# Patient Record
Sex: Male | Born: 1960 | Race: Black or African American | Hispanic: No | Marital: Single | Smoking: Current every day smoker
Health system: Southern US, Community
[De-identification: ages and names within clinical notes are randomized; demographics above are authoritative.]

## PROBLEM LIST (undated history)

## (undated) DIAGNOSIS — K5090 Crohn's disease, unspecified, without complications: Secondary | ICD-10-CM

## (undated) DIAGNOSIS — J45909 Unspecified asthma, uncomplicated: Secondary | ICD-10-CM

## (undated) DIAGNOSIS — E785 Hyperlipidemia, unspecified: Secondary | ICD-10-CM

## (undated) DIAGNOSIS — F209 Schizophrenia, unspecified: Secondary | ICD-10-CM

## (undated) HISTORY — PX: NO PAST SURGERIES: SHX2092

---

## 2003-10-23 ENCOUNTER — Inpatient Hospital Stay (HOSPITAL_COMMUNITY): Admission: EM | Admit: 2003-10-23 | Discharge: 2003-10-26 | Payer: Self-pay | Admitting: Emergency Medicine

## 2003-10-23 ENCOUNTER — Encounter (INDEPENDENT_AMBULATORY_CARE_PROVIDER_SITE_OTHER): Payer: Self-pay | Admitting: *Deleted

## 2003-12-02 ENCOUNTER — Emergency Department (HOSPITAL_COMMUNITY): Admission: EM | Admit: 2003-12-02 | Discharge: 2003-12-02 | Payer: Self-pay | Admitting: Family Medicine

## 2004-01-17 ENCOUNTER — Ambulatory Visit: Payer: Self-pay | Admitting: Family Medicine

## 2004-09-24 ENCOUNTER — Inpatient Hospital Stay (HOSPITAL_COMMUNITY): Admission: EM | Admit: 2004-09-24 | Discharge: 2004-09-27 | Payer: Self-pay | Admitting: Emergency Medicine

## 2005-05-24 ENCOUNTER — Emergency Department (HOSPITAL_COMMUNITY): Admission: EM | Admit: 2005-05-24 | Discharge: 2005-05-24 | Payer: Self-pay | Admitting: Emergency Medicine

## 2005-06-06 ENCOUNTER — Emergency Department (HOSPITAL_COMMUNITY): Admission: EM | Admit: 2005-06-06 | Discharge: 2005-06-06 | Payer: Self-pay | Admitting: Emergency Medicine

## 2005-11-08 ENCOUNTER — Emergency Department (HOSPITAL_COMMUNITY): Admission: EM | Admit: 2005-11-08 | Discharge: 2005-11-08 | Payer: Self-pay | Admitting: Emergency Medicine

## 2006-07-23 ENCOUNTER — Emergency Department (HOSPITAL_COMMUNITY): Admission: EM | Admit: 2006-07-23 | Discharge: 2006-07-23 | Payer: Self-pay | Admitting: Emergency Medicine

## 2006-08-24 ENCOUNTER — Emergency Department (HOSPITAL_COMMUNITY): Admission: EM | Admit: 2006-08-24 | Discharge: 2006-08-24 | Payer: Self-pay | Admitting: Emergency Medicine

## 2006-09-16 ENCOUNTER — Emergency Department (HOSPITAL_COMMUNITY): Admission: EM | Admit: 2006-09-16 | Discharge: 2006-09-16 | Payer: Self-pay | Admitting: Emergency Medicine

## 2007-04-09 ENCOUNTER — Emergency Department (HOSPITAL_COMMUNITY): Admission: EM | Admit: 2007-04-09 | Discharge: 2007-04-09 | Payer: Self-pay | Admitting: Emergency Medicine

## 2009-01-17 ENCOUNTER — Inpatient Hospital Stay (HOSPITAL_COMMUNITY): Admission: AD | Admit: 2009-01-17 | Discharge: 2009-01-23 | Payer: Self-pay | Admitting: Psychiatry

## 2009-01-17 ENCOUNTER — Other Ambulatory Visit: Payer: Self-pay | Admitting: Emergency Medicine

## 2009-01-17 ENCOUNTER — Ambulatory Visit: Payer: Self-pay | Admitting: Psychiatry

## 2010-03-20 ENCOUNTER — Emergency Department (HOSPITAL_COMMUNITY)
Admission: EM | Admit: 2010-03-20 | Discharge: 2010-03-21 | Disposition: A | Discharge status: Other Acute Inpatient Hospital | Payer: Self-pay | Source: Home / Self Care | Admitting: Emergency Medicine

## 2010-03-21 ENCOUNTER — Inpatient Hospital Stay (HOSPITAL_COMMUNITY)
Admission: AD | Admit: 2010-03-21 | Discharge: 2010-03-28 | Payer: Self-pay | Source: Home / Self Care | Attending: Psychiatry | Admitting: Psychiatry

## 2010-03-21 DIAGNOSIS — F259 Schizoaffective disorder, unspecified: Secondary | ICD-10-CM

## 2010-03-21 LAB — ETHANOL: Alcohol, Ethyl (B): <5 mg/dL (ref 0–10)

## 2010-03-21 LAB — DIFFERENTIAL
Basophils Absolute: 0 10*3/uL (ref 0.0–0.1)
Basophils Relative: 0 % (ref 0–1)
Eosinophils Absolute: 0.1 10*3/uL (ref 0.0–0.7)
Eosinophils Relative: 2 % (ref 0–5)
Lymphocytes Relative: 29 % (ref 12–46)
Lymphs Abs: 1.9 10*3/uL (ref 0.7–4.0)
Monocytes Absolute: 0.9 10*3/uL (ref 0.1–1.0)
Monocytes Relative: 14 % — ABNORMAL HIGH (ref 3–12)
Neutro Abs: 3.5 10*3/uL (ref 1.7–7.7)
Neutrophils Relative %: 55 % (ref 43–77)

## 2010-03-21 LAB — COMPREHENSIVE METABOLIC PANEL
ALT: 19 U/L (ref 0–53)
AST: 23 U/L (ref 0–37)
Albumin: 3.3 g/dL — ABNORMAL LOW (ref 3.5–5.2)
Alkaline Phosphatase: 104 U/L (ref 39–117)
BUN: 1 mg/dL — ABNORMAL LOW (ref 6–23)
CO2: 23 mEq/L (ref 19–32)
Calcium: 10.7 mg/dL — ABNORMAL HIGH (ref 8.4–10.5)
Chloride: 102 mEq/L (ref 96–112)
Creatinine, Ser: 0.89 mg/dL (ref 0.4–1.5)
GFR calc Af Amer: >60 mL/min (ref >60)
GFR calc non Af Amer: >60 mL/min (ref >60)
Glucose, Bld: 105 mg/dL — ABNORMAL HIGH (ref 70–99)
Potassium: 3.8 mEq/L (ref 3.5–5.1)
Sodium: 132 mEq/L — ABNORMAL LOW (ref 135–145)
Total Bilirubin: 0.4 mg/dL (ref 0.3–1.2)
Total Protein: 7.8 g/dL (ref 6.0–8.3)

## 2010-03-21 LAB — CBC
HCT: 35.5 % — ABNORMAL LOW (ref 39.0–52.0)
Hemoglobin: 12.1 g/dL — ABNORMAL LOW (ref 13.0–17.0)
MCH: 32.4 pg (ref 26.0–34.0)
MCHC: 34.1 g/dL (ref 30.0–36.0)
MCV: 94.9 fL (ref 78.0–100.0)
Platelets: 350 10*3/uL (ref 150–400)
RBC: 3.74 MIL/uL — ABNORMAL LOW (ref 4.22–5.81)
RDW: 12.1 % (ref 11.5–15.5)
WBC: 6.4 10*3/uL (ref 4.0–10.5)

## 2010-03-21 LAB — VALPROIC ACID LEVEL: Valproic Acid Lvl: 69.2 ug/mL (ref 50.0–100.0)

## 2010-04-02 LAB — COMPREHENSIVE METABOLIC PANEL
ALT: 16 U/L (ref 0–53)
AST: 17 U/L (ref 0–37)
Albumin: 3.1 g/dL — ABNORMAL LOW (ref 3.5–5.2)
Alkaline Phosphatase: 109 U/L (ref 39–117)
BUN: 7 mg/dL (ref 6–23)
CO2: 27 mEq/L (ref 19–32)
Calcium: 10.5 mg/dL (ref 8.4–10.5)
Chloride: 101 mEq/L (ref 96–112)
Creatinine, Ser: 1.01 mg/dL (ref 0.4–1.5)
GFR calc Af Amer: >60 mL/min (ref >60)
GFR calc non Af Amer: >60 mL/min (ref >60)
Glucose, Bld: 86 mg/dL (ref 70–99)
Potassium: 4.4 mEq/L (ref 3.5–5.1)
Sodium: 133 mEq/L — ABNORMAL LOW (ref 135–145)
Total Bilirubin: 0.7 mg/dL (ref 0.3–1.2)
Total Protein: 7.4 g/dL (ref 6.0–8.3)

## 2010-04-02 LAB — DIFFERENTIAL
Basophils Absolute: 0.1 10*3/uL (ref 0.0–0.1)
Basophils Relative: 1 % (ref 0–1)
Eosinophils Absolute: 0.1 10*3/uL (ref 0.0–0.7)
Eosinophils Relative: 3 % (ref 0–5)
Lymphocytes Relative: 32 % (ref 12–46)
Lymphs Abs: 1.7 10*3/uL (ref 0.7–4.0)
Monocytes Absolute: 0.6 10*3/uL (ref 0.1–1.0)
Monocytes Relative: 12 % (ref 3–12)
Neutro Abs: 2.8 10*3/uL (ref 1.7–7.7)
Neutrophils Relative %: 53 % (ref 43–77)

## 2010-04-02 LAB — CBC
HCT: 35.7 % — ABNORMAL LOW (ref 39.0–52.0)
Hemoglobin: 12 g/dL — ABNORMAL LOW (ref 13.0–17.0)
MCH: 32.6 pg (ref 26.0–34.0)
MCHC: 33.6 g/dL (ref 30.0–36.0)
MCV: 97 fL (ref 78.0–100.0)
Platelets: 367 10*3/uL (ref 150–400)
RBC: 3.68 MIL/uL — ABNORMAL LOW (ref 4.22–5.81)
RDW: 11.9 % (ref 11.5–15.5)
WBC: 5.4 10*3/uL (ref 4.0–10.5)

## 2010-05-28 LAB — RAPID URINE DRUG SCREEN, HOSP PERFORMED
Amphetamines: NOT DETECTED
Barbiturates: NOT DETECTED
Benzodiazepines: NOT DETECTED
Cocaine: NOT DETECTED
Opiates: NOT DETECTED
Tetrahydrocannabinol: NOT DETECTED

## 2010-06-20 LAB — DIFFERENTIAL
Basophils Absolute: 0.1 10*3/uL (ref 0.0–0.1)
Basophils Relative: 2 % — ABNORMAL HIGH (ref 0–1)
Eosinophils Absolute: 0.1 10*3/uL (ref 0.0–0.7)
Eosinophils Relative: 2 % (ref 0–5)
Lymphocytes Relative: 34 % (ref 12–46)
Lymphs Abs: 1.8 10*3/uL (ref 0.7–4.0)
Monocytes Absolute: 0.6 10*3/uL (ref 0.1–1.0)
Monocytes Relative: 12 % (ref 3–12)
Neutro Abs: 2.5 10*3/uL (ref 1.7–7.7)
Neutrophils Relative %: 50 % (ref 43–77)

## 2010-06-20 LAB — URINALYSIS, ROUTINE W REFLEX MICROSCOPIC
Bilirubin Urine: NEGATIVE
Glucose, UA: NEGATIVE mg/dL
Hgb urine dipstick: NEGATIVE
Ketones, ur: 40 mg/dL — AB
Nitrite: NEGATIVE
Protein, ur: NEGATIVE mg/dL
Specific Gravity, Urine: 1.019 (ref 1.005–1.030)
Urobilinogen, UA: 1 mg/dL (ref 0.0–1.0)
pH: 6 (ref 5.0–8.0)

## 2010-06-20 LAB — POCT TOXICOLOGY PANEL: Benzodiazepines: POSITIVE

## 2010-06-20 LAB — CBC
HCT: 43.7 % (ref 39.0–52.0)
Hemoglobin: 14.7 g/dL (ref 13.0–17.0)
MCHC: 33.7 g/dL (ref 30.0–36.0)
MCV: 98.5 fL (ref 78.0–100.0)
Platelets: 393 10*3/uL (ref 150–400)
RBC: 4.43 MIL/uL (ref 4.22–5.81)
RDW: 12.6 % (ref 11.5–15.5)
WBC: 5.1 10*3/uL (ref 4.0–10.5)

## 2010-06-20 LAB — ETHANOL: Alcohol, Ethyl (B): <5 mg/dL (ref 0–10)

## 2010-06-20 LAB — COMPREHENSIVE METABOLIC PANEL WITH GFR
ALT: 18 U/L (ref 0–53)
AST: 18 U/L (ref 0–37)
Albumin: 5 g/dL (ref 3.5–5.2)
Alkaline Phosphatase: 163 U/L — ABNORMAL HIGH (ref 39–117)
BUN: 9 mg/dL (ref 6–23)
CO2: 23 meq/L (ref 19–32)
Calcium: 11.6 mg/dL — ABNORMAL HIGH (ref 8.4–10.5)
Chloride: 106 meq/L (ref 96–112)
Creatinine, Ser: 0.9 mg/dL (ref 0.4–1.5)
GFR calc non Af Amer: >60 mL/min
Glucose, Bld: 94 mg/dL (ref 70–99)
Potassium: 4.4 meq/L (ref 3.5–5.1)
Sodium: 141 meq/L (ref 135–145)
Total Bilirubin: 0.8 mg/dL (ref 0.3–1.2)
Total Protein: 9.4 g/dL — ABNORMAL HIGH (ref 6.0–8.3)

## 2010-08-03 NOTE — Consult Note (Signed)
Jeff Long, Jeff Long NO.:  [id number]   MEDICAL RECORD NO.:  [id number]                   PATIENT TYPE:  INP   LOCATION:  3705                                 FACILITY:  [facility]   PHYSICIAN:  James L. Malon Kindle., M.D.          DATE OF BIRTH:  [birth year]   DATE OF CONSULTATION:  10/24/2003  DATE OF DISCHARGE:                                   CONSULTATION   REFERRED BY:  Margarito Liner, M.D.   HISTORY:  A 50 year old African-American male who was admitted with  dizziness.  He has a questionable history of Crohn's disease.  He states  this was diagnosed around 38 in another town.  He is not sure what town or  how it was diagnosed.  He is not clear if he had procedures done or x-rays.  He does not recall ever having a colonoscopy.  He has had orthostatic  changes and dizziness.  His hemoglobin, however, has dropped a bit from 12.7  to 11.  His stools are positive.  He notes he has normal bowel movements,  denies any blood in his stool, and denies diarrhea.  A recent upright  abdominal series was normal.  Looked at this and there may have been a  single air/fluid level.  We are asked to see him regarding possible active  Crohn's or ulcers.  The patient notes that he has had a history of ulcers.  Again, he does not know when, where, or how they were diagnosed.  He denies  any abdominal pain.  His main complaint is nausea, vomiting, and dizziness.   MEDICATIONS ON ADMISSION:  1. Mercaptopurine 50 mg daily.  2. Ditropan XL.  3. Thiothixene 20 mg b.i.d.  4. Benztropine 0.5 mg b.i.d.  5. Klonopin 400 mg q.h.s.  6. Famotidine 20 mg b.i.d.   He denies any drug allergies.   MEDICAL HISTORY:  A history of ulcer disease, history of some sort of  schizophrenia, a history of Crohn's disease.  He denies any abdominal  operations.   SOCIAL HISTORY:  He is single, unemployed.  Has Medicare and Medicaid.  Lives in a group home and does not drink but has smoked  for 30 years.   FAMILY HISTORY:  Mother has no health problems.  He does not know anything  about his siblings.  His father apparently is in his 63s and has no health  problems either.   PHYSICAL EXAMINATION:  VITAL SIGNS:  The patient is afebrile, blood pressure  106/60, pulse 100.  GENERAL:  An pleasant African-American male in no acute distress.  HEENT:  Eyes clear, nonicteric.  NECK:  Supple, no lymphadenopathy.  LUNGS:  Clear.  HEART:  Regular rate and rhythm without murmurs or gallops.  ABDOMEN:  Soft, nondistended and totally nontender.  RECTAL:  No performed but per the Resident his stools were heme-positive.   ASSESSMENT:  Nausea and vomiting with a  history of ulcer and Crohn's disease  and positive stools--this could be due to a multitude of things but does not  appear that he has active Crohn's disease.  I wonder if this could be an  ulcer or could be medication related.   PLAN:  I have discussed options with the patient.  I would recommend an  upper GI and small bowel series which would evaluate for possible ulcer,  outlet obstruction, as well as for active Crohn's.  The patient said that he  has decided that he does not wish to have this done.  He signs for himself  and will not consent to any procedures or x-rays at this time.  This would  be my recommendation if he does consent to further workup.                                               James L. Malon Kindle., M.D.    Waldron Session  D:  10/24/2003  T:  10/25/2003  Job:  [id number]   cc:   Ileana Roup, M.D.  [address] [place] [address]  Fax: [phone]

## 2010-08-03 NOTE — Discharge Summary (Signed)
Jeff Long, Jeff Long NO.:  [id number]   MEDICAL RECORD NO.:  [id number]          PATIENT TYPE:  INP   LOCATION:  1512                         FACILITY:  [facility]   PHYSICIAN:  Fleet Contras, M.D.    DATE OF BIRTH:  1961-03-05   DATE OF ADMISSION:  09/24/2004  DATE OF DISCHARGE:                                 DISCHARGE SUMMARY   error       EA/MEDQ  D:  09/27/2004  T:  09/27/2004  Job:  [id number]

## 2010-08-03 NOTE — Discharge Summary (Signed)
NAMEFARRIS, GEIMAN NO.:  [id number]   MEDICAL RECORD NO.:  [id number]                   PATIENT TYPE:  INP   LOCATION:  3705                                 FACILITY:  [facility]   PHYSICIAN:  Chapman Fitch, MD                    DATE OF BIRTH:  [birth year]   DATE OF ADMISSION:  10/23/2003  DATE OF DISCHARGE:  10/26/2003                                 DISCHARGE SUMMARY   PRIMARY CARE PHYSICIAN:  1. Dr. Hortencia Pilar for psychiatry  2. Dr. Jeri Cos   CHIEF COMPLAINT:  Dizziness.   DISCHARGE DIAGNOSES:  1. Dehydration secondary to gastroenteritis.  2. Acute renal failure, now resolved.  3. Schizophrenia controlled on Clozaril and Navane.  4. History of Crohn's disease in remission since 2002, controlled on 6-MP     followed by [facility].  5. Anemia.  6. Tobacco abuse.   MEDICATIONS:  1. Clozapine 400 mg p.o. q.h.s.  2. Mercaptopurine 50 mg daily p.o.  3. Thiothixene 20 mg p.o. b.i.d.  4. Benztropine 0.5 mg p.o. b.i.d.  5. Ditropan XL 10 mg b.i.d.  6. Famotidine 20 mg b.i.d.   CONDITION ON DISCHARGE:  The patient is stable.   FOLLOWUP:  The patient is to return to Dr. Jeri Cos at 3:45 on August  18 for follow-up of his hospital admission.  He is to return to his  [facility] as well.  He is to return to Dr. Hortencia Pilar at [facility] as directed for his psychiatric needs.   PROCEDURE:  1. On admission on August 7 he had an EKG which showed a normal sinus rhythm     with questionable early repolarization.  Of note, the QTC interval was     increased at 495 msec __________ 144 msec.  An EKG on August 8 showed a     QTC interval at that point had dropped to 444 msec.  2. Plain film x-ray of the abdomen showed distention of the stomach and     proximal duodenum with prominent air fluid levels.  This was on August 8.  3. Also on August 8, a renal ultrasound demonstrated no abnormality noted of     the kidneys which appeared normal in size without hydronephrosis or     stone.   ADMISSION LABORATORIES:  On admission the patient had sodium 124, potassium  3.5, chloride 80, CO2 37 glucose 130, BUN 66, creatinine 5.8, albumin 3.5,  total protein 8.5, calcium 10.3.  AST 14, ALT 9, alkaline phosphatase 88,  total bilirubin 1.3, magnesium 2.2, amylase 64, lipase 50, venous lactic  acid 0.9, serum osmolality 293, urine osmolality 431.  Cardiac enzymes were  negative x3.  Salicylate level was negative.  Urine drug screen was negative  for all detectable drugs.  Urine sodium was 29 mEq/L.  Urine creatinine was  202 mg/dl.  The patient's urinalysis showed small amount of bilirubin,  otherwise no abnormalities.  CBC showed a white count of 10.7, a hemoglobin  of 12.7, a hematocrit of 37.9, MCV of 95.8, platelet count of 518.  ABG  showed a pH of 7.49, pCO2 of 59.8, bicarbonate of 46.5, a total CO2 of 48,  O2 saturation of 93.5.  On admission the patient was afebrile at 37.0.   CONSULTS:  1. Dr. Jeanie Sewer of psychiatry  2. Dr. Carman Ching, GI consult   BRIEF ADMISSION HISTORY AND PHYSICAL:  This is a 50 year old African-  American male with past medical history of schizophrenia, Crohn's disease,  and anemia who was admitted to the hospital on October 23, 2003 with a three  to four day history of intractable nausea and vomiting and dizziness with a  possible syncopal episode.  Denies any fever, chills, shortness of breath,  chest pain, palpitations.  The nausea and vomiting was described as several  times a day with no hematemesis.  The patient had not had any changes in  diet recently.  Without blood in the stool, dysuria, or hematuria.  He  endorses adequate p.o. intake even with his nausea and vomiting.  Denies any  palpitations.  Of note, the patient was discharged from [facility] approximately one month ago and has since been living at the  [facility] in  [place].   HOSPITAL COURSE:  #1 - DIZZINESS:  The patient was resuscitated in the ER  using a bolus of normal saline.  He was also noted at time to be  orthostatic.  After resuscitation he was considered stable and was  transferred to the floor.  His electrolytes suggested a contraction  alkalosis and patient was resuscitated with normal saline.  He was then  admitted to the floor where he was continued to be resuscitated with normal  saline and he improved by the morning where he underwent a renal ultrasound  to rule out obstruction.  This was negative.  A plain film of the abdomen to  rule out small bowel obstruction was also negative for any type of specific  obstructive pattern.  At this time focused on resuscitating his volume  status.  He tolerated p.o. well and moved from clears to solid fluids by the  9th.  Upon discharge he was no longer orthostatic and his laboratory values  had improved.  His sodium was 139, potassium 4.3, chloride 107, bicarbonate  28, BUN 17, creatinine 1.4.  After day two of his hospitalization he had no  more further vomiting or nausea.  Due to this fact and the fact that his  symptoms improved on volume resuscitation, it is likely that his dizziness  was due to a hypovolemic state versus a cardiac etiology to his syncope  which could also be possible due to his elevated QTC interval.  #2 - ACUTE RENAL FAILURE:  No baseline creatinine was known for the patient  but on admission his creatinine was 5.8.  He had no documented history of  renal failure in the chart.  On volume resuscitation his creatinine fell  from 5.8 to 1.4.  It is likely that this is due to a prerenal etiology due  to his low volume state as opposed to chronic renal failure or obstructive  renal failure since no urine eosinophils were seen and no obstruction was  found on renal ultrasound.  #3 - SCHIZOPHRENIA:  The patient was discontinued on his Clozaril on admission due to  the  possibility of increasing his QTC interval and causing  his possible syncopal episode.  The patient was continued on his Navane as  well as his benztropine.  During his admission the patient initially was  cooperative which is his baseline.  During his admission he became slowly  more agitated and began refusing medications as well as his laboratory draws  as well as food.  Dr. Jeanie Sewer from psychiatry was consulted.  Initially,  he was interested in looking over the past records on Mr. Hammonds to make  sure that everything had previously been tried.  Records from [facility] were obtained which showed that the patient had been tried on  multiple different regimens during his stay there.  He initially was tried  on olanzapine, Moban, Navane, and Luvox; however, he was unable to be  controlled on this regimen.  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  He  was discontinued on Clozaril at that time.  This was back in 2000.  After  failing other regimens he was then restarted on his Clozaril in 2003 along  with Navane.  It was felt, according to the records at [facility], that he was well controlled on his Clozaril in terms of stopping  his aggression and the Navane was helping with his hallucinations.  He  remained stable on Clozaril and Navane without any more episodes and was  discharged from [facility] in 2005 about one month before admission.  When  those records were shown to Dr. Jeanie Sewer he suggested that the patient  remain on his Navane and Clozaril and his Clozaril was readministered at 400  mg p.o. daily.  The patient then became more cooperative.  On the date of  discharge he took his medication and his laboratories.  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  He will follow up with his normal  primary care providers for follow-up on his white blood cell count.  He will  also need a follow-up EKG to determine if his QTC interval has increased.  Per Dr. Jeanie Sewer, it is likely that since he has been refractory to so many  different schizophrenic medications that the QTC interval may be something  he has to live with in order to control his schizophrenia.  #4 CROHN'S DISEASE:  The patient has been asymptomatic from Crohn's disease  since 2002 where he underwent a colonoscopy which showed some friable mucosa  in the colonic area as well as some evidence of gastritis.  The patient has  been followed up by [facility].  Last appointment he had was with  Dr. Anne Shutter in January of 2004.  Due to this Crohn's history and his  presentation with possible obstruction as well as hematocrit that was  suggestive of anemia, Dr. Randa Evens suggested the patient undergo an upper GI with a small bowel follow through to try to discover the source of a  possible bleed.  The patient refused this intervention stating that he  wanted to go home and would not tolerate it.  We feel that since his volume  state was resuscitated and he was doing better that this is something that  is not needed emergently  as an inpatient.  However, we feel that he may need  some follow-up for that in the future as an outpatient.  #5 - ANEMIA:  The patient has hematocrit went from 12.7 to 11.8 on  discharge.  The patient was asymptomatic on discharge and we did not feel  like we needed to check his hemoglobin at this time.  However, it should be  at his follow-up appointment that he has his hemoglobin checked to check for  any interval change of anemia.  #6 - TOBACCO ABUSE:  The patient was counseled to stop smoking.  He denied a  smoking cessation consult, stating that he does not feel like he wants to  quit at this  juncture.      Dub Amis, MD    PA/MEDQ  D:  10/27/2003  T:  10/28/2003  Job:  [id number]   cc:   Jarome Matin, M.D.  [address] [place]  [place] [address]  Fax: [phone]   Antonietta Breach, M.D.

## 2010-08-03 NOTE — Discharge Summary (Signed)
NAMEAMIAS, Jeff NO.:  [id number]   MEDICAL RECORD NO.:  [id number]          PATIENT TYPE:  INP   LOCATION:  1512                         [facility]ACILITY[facility]   PHYSICIAN:  Fleet Contras, M.D.    DATE OF BIRTH:  [birth year]   DATE OF ADMISSION:  09/24/2004  DATE OF DISCHARGE:  09/27/2004                                 DISCHARGE SUMMARY   HISTORY OF PRESENT ILLNESS:  Mr. Eads is a 50 year old, African-American  gentleman with past medical history significant for schizophrenia.  He is a  resident at a family care group home.  He presented to the emergency room  with complaints of 1 week history of abdominal pain associated with nausea  and vomiting.  This has progressively worsened and on the day of  presentation he was unable to keep his food and drink down.  In the  emergency room, his vital signs were stable, but he was dehydrated.  The  initial laboratory data showed sodium of 131.  Abdominal x-ray shows  evidence of partial small bowel obstruction likely due to ileus.  He was  therefore admitted to the hospital for monitoring and IV therapy.   HOSPITAL COURSE:  On admission, the patient was placed on a clear liquid  diet, IV fluids with D-5 normal saline, potassium as well as IV Protonix 40  mg once a day.  His symptoms improved significantly and was able to tolerate  advance in his diet.  CT scan of the abdomen was performed and did show  mucosal thickening of the terminal ileum, cecum, transverse and descending  colon consistent with inflammatory bowel disease.  He has a history of  Crohn's disease.  Due to his improvement with conservative management, no  further intervention was performed.  Today, he is completely pain free.  He  is tolerating full diet.  His vital signs are stable.  His abdomen is soft,  nontender with bowel sounds present.  He is therefore considered stable for  discharge home.   DISCHARGE MEDICATIONS:  1.  Ditropan XL 5 mg once a  day.  2.  Clozaril 400 mg q.h.s.  3.  Cogentin 0.5 mg b.i.d.  4.  Protonix 40 mg once a day.   FOLLOW UP:  He will follow up with Dr. Fleet Contras in 2 weeks.   DISPOSITION:  To home.   CONDITION ON DISCHARGE:  Stable.       EA/MEDQ  D:  09/27/2004  T:  09/27/2004  Job:  [id number]

## 2010-08-29 ENCOUNTER — Inpatient Hospital Stay (HOSPITAL_COMMUNITY)
Admission: EM | Admit: 2010-08-29 | Discharge: 2010-09-03 | DRG: 386 | Disposition: A | Discharge status: Home / Self Care | Payer: Medicaid Other | Source: Ambulatory Visit | Attending: Internal Medicine | Admitting: Internal Medicine

## 2010-08-29 ENCOUNTER — Emergency Department (HOSPITAL_COMMUNITY): Payer: Medicaid Other

## 2010-08-29 DIAGNOSIS — I319 Disease of pericardium, unspecified: Secondary | ICD-10-CM | POA: Diagnosis present

## 2010-08-29 DIAGNOSIS — R0789 Other chest pain: Secondary | ICD-10-CM | POA: Diagnosis present

## 2010-08-29 DIAGNOSIS — F17200 Nicotine dependence, unspecified, uncomplicated: Secondary | ICD-10-CM | POA: Diagnosis present

## 2010-08-29 DIAGNOSIS — F319 Bipolar disorder, unspecified: Secondary | ICD-10-CM | POA: Diagnosis present

## 2010-08-29 DIAGNOSIS — R627 Adult failure to thrive: Secondary | ICD-10-CM | POA: Diagnosis present

## 2010-08-29 DIAGNOSIS — K5010 Crohn's disease of large intestine without complications: Principal | ICD-10-CM | POA: Diagnosis present

## 2010-08-29 DIAGNOSIS — F209 Schizophrenia, unspecified: Secondary | ICD-10-CM | POA: Diagnosis present

## 2010-08-29 DIAGNOSIS — K56609 Unspecified intestinal obstruction, unspecified as to partial versus complete obstruction: Secondary | ICD-10-CM | POA: Diagnosis present

## 2010-08-29 DIAGNOSIS — D509 Iron deficiency anemia, unspecified: Secondary | ICD-10-CM | POA: Diagnosis present

## 2010-08-29 DIAGNOSIS — IMO0002 Reserved for concepts with insufficient information to code with codable children: Secondary | ICD-10-CM

## 2010-08-29 DIAGNOSIS — R634 Abnormal weight loss: Secondary | ICD-10-CM | POA: Diagnosis present

## 2010-08-29 LAB — DIFFERENTIAL
Basophils Absolute: 0.1 10*3/uL (ref 0.0–0.1)
Basophils Relative: 1 % (ref 0–1)
Eosinophils Absolute: 0.2 10*3/uL (ref 0.0–0.7)
Eosinophils Relative: 2 % (ref 0–5)
Lymphocytes Relative: 31 % (ref 12–46)
Lymphs Abs: 2.5 10*3/uL (ref 0.7–4.0)
Monocytes Absolute: 0.8 10*3/uL (ref 0.1–1.0)
Monocytes Relative: 11 % (ref 3–12)
Neutro Abs: 4.4 10*3/uL (ref 1.7–7.7)
Neutrophils Relative %: 56 % (ref 43–77)

## 2010-08-29 LAB — COMPREHENSIVE METABOLIC PANEL
AST: 8 U/L (ref 0–37)
Albumin: 2.2 g/dL — ABNORMAL LOW (ref 3.5–5.2)
Calcium: 10.3 mg/dL (ref 8.4–10.5)
Creatinine, Ser: 0.71 mg/dL (ref 0.4–1.5)
Total Protein: 6.9 g/dL (ref 6.0–8.3)

## 2010-08-29 LAB — CBC
HCT: 25.8 % — ABNORMAL LOW (ref 39.0–52.0)
Hemoglobin: 8.5 g/dL — ABNORMAL LOW (ref 13.0–17.0)
MCH: 32.2 pg (ref 26.0–34.0)
MCHC: 32.9 g/dL (ref 30.0–36.0)
MCV: 97.7 fL (ref 78.0–100.0)
Platelets: 414 10*3/uL — ABNORMAL HIGH (ref 150–400)
RBC: 2.64 MIL/uL — ABNORMAL LOW (ref 4.22–5.81)
RDW: 15.6 % — ABNORMAL HIGH (ref 11.5–15.5)
WBC: 7.9 10*3/uL (ref 4.0–10.5)

## 2010-08-29 LAB — CARDIAC PANEL(CRET KIN+CKTOT+MB+TROPI)
CK, MB: 0.8 ng/mL (ref 0.3–4.0)
Relative Index: INVALID (ref 0.0–2.5)
Total CK: 14 U/L (ref 7–232)
Troponin I: <0.3 ng/mL (ref <0.30)

## 2010-08-29 LAB — OCCULT BLOOD, POC DEVICE: Fecal Occult Bld: POSITIVE

## 2010-08-30 ENCOUNTER — Observation Stay (HOSPITAL_COMMUNITY): Payer: Medicaid Other

## 2010-08-30 LAB — LIPID PANEL
Cholesterol: 148 mg/dL (ref 0–200)
Triglycerides: 96 mg/dL (ref <150)
VLDL: 19 mg/dL (ref 0–40)

## 2010-08-30 LAB — IRON AND TIBC
Iron: 30 ug/dL — ABNORMAL LOW (ref 42–135)
Saturation Ratios: 17 % — ABNORMAL LOW (ref 20–55)
TIBC: 178 ug/dL — ABNORMAL LOW (ref 215–435)
UIBC: 148 ug/dL

## 2010-08-30 LAB — TSH: TSH: 1.047 u[IU]/mL (ref 0.350–4.500)

## 2010-08-30 LAB — LITHIUM LEVEL: Lithium Lvl: <0.25 mEq/L — ABNORMAL LOW (ref 0.80–1.40)

## 2010-08-30 LAB — CARDIAC PANEL(CRET KIN+CKTOT+MB+TROPI)
CK, MB: 1 ng/mL (ref 0.3–4.0)
Total CK: 9 U/L (ref 7–232)

## 2010-08-30 LAB — VITAMIN B12
Vitamin B-12: 675 pg/mL (ref 211–911)
Vitamin B-12: 752 pg/mL (ref 211–911)

## 2010-08-30 LAB — CBC
HCT: 23.3 % — ABNORMAL LOW (ref 39.0–52.0)
HCT: 28.5 % — ABNORMAL LOW (ref 39.0–52.0)
Hemoglobin: 9.2 g/dL — ABNORMAL LOW (ref 13.0–17.0)
MCHC: 33.2 g/dL (ref 30.0–36.0)
MCV: 97.1 fL (ref 78.0–100.0)
Platelets: 374 10*3/uL (ref 150–400)
RBC: 2.4 MIL/uL — ABNORMAL LOW (ref 4.22–5.81)
RBC: 2.96 MIL/uL — ABNORMAL LOW (ref 4.22–5.81)
RDW: 15.5 % (ref 11.5–15.5)
WBC: 5.8 10*3/uL (ref 4.0–10.5)
WBC: 5.1 10*3/uL (ref 4.0–10.5)

## 2010-08-30 LAB — COMPREHENSIVE METABOLIC PANEL
ALT: <5 U/L (ref 0–53)
Calcium: 10 mg/dL (ref 8.4–10.5)
Creatinine, Ser: 0.76 mg/dL (ref 0.4–1.5)
GFR calc Af Amer: >60 mL/min (ref >60)
Glucose, Bld: 85 mg/dL (ref 70–99)
Sodium: 138 mEq/L (ref 135–145)
Total Protein: 5.9 g/dL — ABNORMAL LOW (ref 6.0–8.3)

## 2010-08-30 LAB — LACTIC ACID, PLASMA: Lactic Acid, Venous: 0.5 mmol/L (ref 0.5–2.2)

## 2010-08-30 LAB — VALPROIC ACID LEVEL: Valproic Acid Lvl: 29.4 ug/mL — ABNORMAL LOW (ref 50.0–100.0)

## 2010-08-30 LAB — PROTIME-INR: INR: 1.14 (ref 0.00–1.49)

## 2010-08-30 LAB — MAGNESIUM: Magnesium: 1.7 mg/dL (ref 1.5–2.5)

## 2010-08-30 MED ORDER — IOHEXOL 300 MG/ML  SOLN
100.0000 mL | Freq: Once | INTRAMUSCULAR | Status: AC | PRN
  Administered 2010-08-30: 100 mL via INTRAVENOUS

## 2010-08-31 ENCOUNTER — Other Ambulatory Visit: Payer: Self-pay | Admitting: Gastroenterology

## 2010-08-31 DIAGNOSIS — R072 Precordial pain: Secondary | ICD-10-CM

## 2010-08-31 LAB — TYPE AND SCREEN: ABO/RH(D): AB POS

## 2010-08-31 LAB — BASIC METABOLIC PANEL
Chloride: 110 mEq/L (ref 96–112)
GFR calc Af Amer: >60 mL/min (ref >60)
Potassium: 4.2 mEq/L (ref 3.5–5.1)

## 2010-08-31 LAB — ANA: Anti Nuclear Antibody(ANA): NEGATIVE

## 2010-08-31 LAB — CBC
Platelets: 339 10*3/uL (ref 150–400)
RDW: 15.5 % (ref 11.5–15.5)
WBC: 4.4 10*3/uL (ref 4.0–10.5)

## 2010-09-01 LAB — CBC
Hemoglobin: 9.7 g/dL — ABNORMAL LOW (ref 13.0–17.0)
MCHC: 32.6 g/dL (ref 30.0–36.0)
RBC: 3.08 MIL/uL — ABNORMAL LOW (ref 4.22–5.81)

## 2010-09-01 LAB — BASIC METABOLIC PANEL
CO2: 25 mEq/L (ref 19–32)
GFR calc non Af Amer: >60 mL/min (ref >60)
Glucose, Bld: 157 mg/dL — ABNORMAL HIGH (ref 70–99)
Potassium: 4.6 mEq/L (ref 3.5–5.1)
Sodium: 137 mEq/L (ref 135–145)

## 2010-09-09 NOTE — Discharge Summary (Signed)
NAMEAVIRAJ, KENTNER NO.:  [id number]  MEDICAL RECORD NO.:  [id number]  LOCATION:  3731                         FACILITY:  [facility]  PHYSICIAN:  Ladell Pier, M.D.   DATE OF BIRTH:  [birth year]  DATE OF ADMISSION:  08/29/2010 DATE OF DISCHARGE:  09/01/2010                              DISCHARGE SUMMARY   DISCHARGE DIAGNOSES: 1. Crohn colitis with abdominal pain. 2. Chest pain that is not resolved but negative echo. 3. Weight loss. 4. Failure to thrive. 5. Schizophrenia. 6. Bipolar disorder. 7. Ongoing tobacco use. 8. Anemia.  DISCHARGE MEDICATIONS: 1. Benztropine 1 mg twice daily. 2. Depakote 500 mg tablet 1000 mg at bedtime. 3. Iron sulfate 325 mg daily. 4. Zyprexa 20 mg at bedtime. 5. Prednisone 40 mg daily for 1 week and then 30 mg daily. 6. Vitamin D3 with 1000 units daily. 7. Haldol 10 mg 1 tablet in the morning and 2 tablets p.m. 8. Haldol injections every 4 weeks 1 mL IM.  FOLLOWUP APPOINTMENTS:  The patient is to follow up with Dr. Evette Cristal on July 2 at 2 p.m.  PROCEDURES:  Colonoscopy done by Dr. Evette Cristal, showed severe Crohn colitis with ulcers, cobblestoning, edema of mucosa, friability of mucosa, and question of a stricture in the proximal descending colon.  CONSULTANTS:  GI, Dr. Evette Cristal.  OTHER PROCEDURES:  CT scan of the chest trace bilateral pleural effusion, no acute pulmonary disease.  CT scan of the abdomen and pelvis showed diffuse colitis with inflammation of the terminal ileum and appendix, findings most compatible with irritable bowel disease infection including C.diff should be considered as well, ischemia unlikely based on the distribution and relative lack of atherosclerotic changes, vague low density lesion in the right hepatic lobe adjacent to a cyst of uncertain clinical significance.  No distortion of vessel that may represent a focal area of fatty infiltration and small amount of reactive ascites.  HISTORY OF PRESENT  ILLNESS:  The patient is a 50 year old African American male with past medical history significant for schizophrenia and bipolar disorder.  The patient with known history of Crohn disease, came in, presented with abdominal pain for the past few months.  The patient also has been having chronic diarrhea at least 4-5 episodes everyday.  He states he did not notice any blood in it.  He also had some nausea and vomiting yesterday, one episode on the way to the ER. He developed some chest pain which lasted for a few minutes in the anterior wall of the chest.  Please see admission note for remainder of history, past medical history, family history, social history, meds, allergies, review of systems per admission H and P.  PHYSICAL EXAMINATION:  VITAL SIGNS:  Temperature 97.5, pulse of 59, respirations 16, blood pressure 148/81, and pulse ox 100% on room air. GENERAL:  The patient is laying in bed, well-nourished African American male. HEENT:  Normocephalic and atraumatic.  Pupils reactive to light without erythema. CARDIOVASCULAR:  Regular rate and rhythm. LUNGS:  Clear bilaterally. ABDOMEN:  Positive bowel sounds.  Soft, nontender. EXTREMITIES:  Without edema.  HOSPITAL COURSE: 1. Crohn colitis:  The patient was admitted to the hospital.  GI was  consulted on the patient.  He had a colonoscopy done that did show     inflammatory bowel disease.  He was placed on prednisone switched     to Solu-Medrol IV and then he will be switched to prednisone 40 mg     daily for 7 days and then 30 mg daily and follow up with Dr. Evette Cristal     in the office, at that time other adjustments can be made to his     medications. 2. Bipolar/schizophrenia, continue on his home medications. 3. Anemia:  This could be secondary to his Crohn colitis and     inflammatory process that goes with that.  DISCHARGE LABS:  Sodium 137, potassium 4.6, chloride 109, CO2 of 25, glucose 157, BUN 4, creatinine 0.69.  WBC 5.8,  hemoglobin 9.7, MCV 96.8, and platelet of 389.  ANA negative.  B12 752. HIV negative.  C. diff negative.  Time spent with the patient and doing this discharge is approximately 40 minutes.     Ladell Pier, M.D.     [place]/MEDQ  D:  09/03/2010  T:  09/03/2010  Job:  [id number]  Electronically Signed by Ladell Pier M.D. on 09/09/2010 07:34:50 PM

## 2010-09-10 NOTE — H&P (Signed)
Jeff Long, Jeff Long NO.:  [id number]  MEDICAL RECORD NO.:  [id number]  LOCATION:  [facility]                         FACILITY:  [facility]  PHYSICIAN:  Eduard Clos, MDDATE OF BIRTH:  [birth year]  DATE OF ADMISSION:  08/29/2010 DATE OF DISCHARGE:                             HISTORY & PHYSICAL   PRIMARY CARE PHYSICIAN:  Fleet Contras, MD  CHIEF COMPLAINT:  Abdominal pain, chest pain, weight loss.  HISTORY OF PRESENT ILLNESS:  A 50 year old male with known history of schizophrenia and bipolar disorder with questionable history of Crohn disease, has been experiencing abdominal pain over the last few months. The patient also has been having chronic diarrhea at least 4-5 episodes every day.  He states he did not notice any blood in it.  He also had some nausea, vomiting yesterday one episode and on the way to the ER, he developed some chest pain which lasted for few minutes, his left anterior chest wall, nonradiating and self-limited and has resolved at this time.  In the ER, the patient was found to have hemoglobin around 8.5 which is significant drop from what he had in January which was 12. At this time, the patient will be admitted for further workup for his abdominal pain, weight loss, which he says is around 40 pounds along with a chronic diarrhea and also had chest pain.  The patient denies any shortness of breath.  Denies any dizziness, loss of consciousness.  Does feel weak.  Denies any focal deficit.  Denies any dysuria or discharges.  PAST MEDICAL HISTORY:  Schizophrenia, bipolar disorder, questionable history of Crohn disease, history of iron deficiency anemia, ongoing tobacco abuse.  PAST SURGICAL HISTORY:  None as per patient.  Medications prior to admission which needs to be verified includes: 1. Haldol 10 mg p.o. daily. 2. Haldol 10 mg 2 tablets at bedtime. 3. Haldol Decanoate 50 mg IM q.4 weekly. 4. Zyprexa 20 mg at bedtime. 5. Cogentin 1 mg  p.o. b.i.d. 6. Depakote 500 mg 2 at bedtime. 7. Ferrous sulfate 325 mg p.o. daily. 8. Vitamin D 3 1000 units daily.  ALLERGIES:  The patient is allergic to LITHIUM, PROLIXIN, THORAZINE.  SOCIAL HISTORY:  The patient smokes cigarettes.  Denies any alcohol or drug abuse.  Lives in a group home, is a full code.  FAMILY HISTORY:  Significant for father having colon cancer, prostate cancer, lung cancer, and also there is diabetes in the family.  REVIEW OF SYSTEMS:  Per history of presenting illness, nothing else significant.  PHYSICAL EXAMINATION:  GENERAL:  The patient examined at bedside not in acute distress. VITAL SIGNS:  Blood pressure 107/76, pulse 88 per minute, temperature 97.3, respirations 18 per minute, O2 sat 100%. HEENT:  Anicteric.  Mild pallor.  No facial asymmetry.  Tongue is midline.  No neck rigidity.  No facial asymmetry. CHEST:  Bilateral air entry present.  No rhonchi, no crepitation. HEART:  S1 and S2 heard. ABDOMEN:  Soft, nontender.  Bowel sounds present. CNS:  Patient alert, awake, and oriented to time, place, and person. Moves upper and lower extremities 5/5. EXTREMITIES:  There is no peripheral edema.  No muscle tenderness.  LABORATORY DATA:  EKG shows normal sinus rhythm, heart rate around 82 beats per minute with diffuse ST-T changes with some PR depression which may be concerning for pericarditis.  CBC, WBC is 7.9, hemoglobin is 8.5, hematocrit is 25.8, platelets 414.  Complete metabolic panel, sodium 138, potassium 3.3, chloride 104, carbon dioxide 28, glucose 80, BUN 8, creatinine 0.7, total bilirubin is 0.2, alkaline phosphatase is 75, AST 8, ALT less than 5, total protein 6.9, albumin 2.2, calcium 10.3, lipase 30.  CK is 14, CK-MB is 0.8, troponin less than 0.3.  Fecal occult blood is positive.  ASSESSMENT: 1. Abdominal pain with chronic diarrhea, anemia, and weight loss. 2. Chest pain with EKG concerning for pericarditis. 3. Failure to  thrive. 4. Schizophrenia. 5. History of bipolar disorder. 6. Ongoing tobacco abuse.  PLAN: 1. At this time, we will admit patient to telemetry. 2. For his chest pain at this time the patient is chest pain free.  We     are going to cycle cardiac markers.  I am going to get 2-D echo.  I     will discuss the EKG with the cardiologist. 3. For his abdominal pain, anemia, and stool guaiac positive and     weight loss, at this time I am going to get a repeat CBC within the     next hour, type and crossmatch 2 units and hold, and I am going to     get stool studies as patient has chronic diarrhea including HIV     test.  We will check anemia panel. 4. At this time, I am going to get a CT chest, abdomen, and pelvis     with contrast. 5. I did discuss with Dr. Madilyn Fireman on-call for GI, who will be seeing the     patient later. 6. We will check CBC q.6  hourly, type and crossmatch 2 units.  I am     going to check the lactic acid level and further recommendation as     condition evolves.     Eduard Clos, MD     ANK/MEDQ  D:  08/29/2010  T:  08/29/2010  Job:  [id number]  cc:   Fleet Contras, M.D.  Electronically Signed by Midge Minium MD on 09/10/2010 07:35:06 AM

## 2010-10-10 NOTE — Consult Note (Signed)
Jeff Long, Jeff Long NO.:  [id number]  MEDICAL RECORD NO.:  [id number]  LOCATION:  3731                         FACILITY:  [facility]  PHYSICIAN:  Graylin Shiver, M.D.   DATE OF BIRTH:  [birth year]  DATE OF CONSULTATION:  08/30/2010 DATE OF DISCHARGE:                                CONSULTATION   REASON FOR CONSULTATION:  This patient is a 50 year old male who we are seeing on unassigned call at [facility] because of abdominal pain, diarrhea, and weight loss.  The patient states that back in the 1990s he was diagnosed at another hospital with Crohn disease.  He recalls being treated in the past with Crohn disease, but then sometime in the early to mid 2000 he states that he went to [facility] and [facility] where he was evaluated, had colonoscopies, and was told he did not have Crohn disease.  He states that he has not been on any treatment for Crohn disease in many years. Since February 2012, he has been experiencing diarrhea and also some mild abdominal pain.  He has also lost around 40 pounds, but says that he is losing this weight because he is not eating because he does not like the way they prepare the food at the group home that he lives in. He has a history of schizophrenia and bipolar disorder and has had psychiatric assessments and admissions in the past.  It is hard to tell in talking to him whether his history is true or not given this psychiatric history.  His hemoglobin and hematocrit on admission were 8.5 and 25.8 respectively.  He is not passing any blood in his stools according to him, but his stools are Hemoccult positive.  A CT scan of the abdomen and pelvis done today shows diffuse colitis with inflammation of the terminal ileum and appendix most compatible with inflammatory bowel disease.  PAST HISTORY: 1. Schizophrenia. 2. Bipolar disorder. 3. History of Crohn disease.  PAST SURGICAL HISTORY:  None.  MEDICATIONS:  Haldol,  Zyprexa, Cogentin, Depakote, ferrous sulfate, and vitamin D.  ALLERGIES: 1. LITHIUM. 2. PROLIXIN. 3. THORAZINE.  SOCIAL HISTORY:  He smokes.  Denies alcohol.  He lives in a group home.  PHYSICAL EXAMINATION:  GENERAL:  He does not appear in any acute distress. EYES;  Nonicteric. HEART:  Regular rhythm.  No murmurs. LUNGS:  Clear. ABDOMEN:  Soft.  Bowel sounds normal.  Nontender.  No hepatosplenomegaly  IMPRESSION: 1. Anemia. 2. History of Crohn disease. 3. Abnormal CT findings consistent with inflammatory bowel disease.  PLAN:  I suspect that he does have Crohn disease and perhaps when he was checked out at [facility] or [facility], he could have been in remission. His current CT findings are suggestive of inflammatory bowel disease. His weight loss could be secondary to activity of disease.  I will schedule him for a colonoscopy.  We will check his stool for Clostridium difficile.  I will check a B12 level since it appears that he has involvement of terminal ileum.          ______________________________ Graylin Shiver, M.D.     SFG/MEDQ  D:  08/30/2010  T:  08/31/2010  Job:  [id number]  cc:   [facility] Fleet Contras, M.D.  Electronically Signed by Herbert Moors MD on 10/10/2010 03:42:48 PM

## 2010-10-10 NOTE — Op Note (Signed)
  [name], [name]:  [id number]  MEDICAL RECORD NO.:  [id number]  LOCATION:  3731                         FACILITY:  [facility]  PHYSICIAN:  Graylin Shiver, M.D.   DATE OF BIRTH:  Sep 13, 1960  DATE OF PROCEDURE:  08/31/2010 DATE OF DISCHARGE:                              OPERATIVE REPORT   INDICATIONS FOR PROCEDURE:  The patient is a 50 year old male with a history of Crohn disease which was originally diagnosed back in the 1990s.  He then states he was told in the 2000s when he went to [facility] and [facility] that he did not have Crohn disease.  He presented to this hospital with complaints of diarrhea and abdominal pain.  CT scan showed evidence of colitis and ileitis which appears to be compatible with diagnosis of Crohn disease.  He has not been on any treatment for a long time for Crohn disease, but does recall being on it in the past in the 1990s and early 2000s.  Colonoscopy is being done to further evaluate.  Informed consent was obtained after explanation of the risks of bleeding, infection, and perforation.  PREMEDICATION: 1. Fentanyl 50 mcg IV. 2. Versed 6 mg IV. 3. Benadryl 25 mg IV.  PROCEDURE:  With the patient in the left lateral decubitus position, a rectal exam was performed and no masses were felt.  I did not see any external lesions.  The colonoscope was inserted into the rectum and advanced up the colon to the proximal descending colon region.  The mucosa was diffusely edematous and ulcerated and cobblestone.  There were linear ulcers.  The findings looked compatible with Crohn disease. The distal rectum seemed relatively spared.  In the proximal descending colon, the mucosa was very edematous appearing and the lumen was narrowed.  I could not get through this point with the scope.  I therefore questioned the possibility of a stricture at this point versus severe edema.  The mucosa throughout was very friable.  Biopsies were obtained  for histology.  He tolerated the procedure well without complications.  IMPRESSION:  Severe Crohn colitis with ulcers, cobblestoning, edema of mucosa, friability of mucosa, and question of a stricture in the proximal descending colon.  RECOMMENDATIONS:  I will start the patient on Solu-Medrol 40 mg IV q.8 h.  I will check a PPD skin test.  He has already had a CT of the chest which did not show anything serious.  He may be a candidate for Remicade given the severity of his disease.  I am not sure, however, whether he will be compliant.  This remains to be determined.  The biopsies will be checked.  We need to consider doing a barium enema in a few weeks after treatment is started to see if he has a colonic stricture or if this was simply edema that could not be traversed with the scope.          ______________________________ Graylin Shiver, M.D.     SFG/MEDQ  D:  08/31/2010  T:  09/01/2010  Job:  [id number]  cc:   [facility]  Electronically Signed by Herbert Moors MD on 10/10/2010 03:42:51 PM

## 2010-12-06 LAB — RAPID URINE DRUG SCREEN, HOSP PERFORMED
Amphetamines: NOT DETECTED
Barbiturates: NOT DETECTED
Benzodiazepines: POSITIVE — AB
Opiates: NOT DETECTED

## 2010-12-06 LAB — URINALYSIS, ROUTINE W REFLEX MICROSCOPIC
Glucose, UA: NEGATIVE
Hgb urine dipstick: NEGATIVE
Specific Gravity, Urine: 1.003 — ABNORMAL LOW
pH: 6.5

## 2010-12-06 LAB — BASIC METABOLIC PANEL
CO2: 26
Calcium: 10.3
GFR calc Af Amer: >60
Sodium: 137

## 2010-12-06 LAB — DIFFERENTIAL
Basophils Relative: 2 — ABNORMAL HIGH
Eosinophils Absolute: 0.1
Eosinophils Relative: 3
Lymphocytes Relative: 35
Monocytes Relative: 11
Neutro Abs: 2.4

## 2010-12-06 LAB — ETHANOL: Alcohol, Ethyl (B): 13 — ABNORMAL HIGH

## 2010-12-06 LAB — CBC
Hemoglobin: 11.3 — ABNORMAL LOW
MCHC: 33.8
RBC: 3.42 — ABNORMAL LOW
WBC: 4.7

## 2011-01-01 LAB — RAPID URINE DRUG SCREEN, HOSP PERFORMED
Cocaine: NOT DETECTED
Tetrahydrocannabinol: NOT DETECTED

## 2011-01-01 LAB — DIFFERENTIAL
Basophils Absolute: 0
Basophils Relative: 1
Eosinophils Absolute: 0.2
Monocytes Absolute: 0.9 — ABNORMAL HIGH
Monocytes Relative: 10
Neutro Abs: 6.9
Neutrophils Relative %: 72

## 2011-01-01 LAB — URINALYSIS, ROUTINE W REFLEX MICROSCOPIC
Glucose, UA: NEGATIVE
Leukocytes, UA: NEGATIVE
Nitrite: NEGATIVE
Specific Gravity, Urine: 1.03
pH: 7

## 2011-01-01 LAB — CBC
Hemoglobin: 12.3 — ABNORMAL LOW
MCHC: 33.6
MCV: 95.8
RBC: 3.82 — ABNORMAL LOW
RDW: 13.8

## 2011-01-01 LAB — BASIC METABOLIC PANEL
CO2: 31
Calcium: 11.1 — ABNORMAL HIGH
Chloride: 99
Creatinine, Ser: 0.96
Glucose, Bld: 115 — ABNORMAL HIGH

## 2011-01-01 LAB — URINE MICROSCOPIC-ADD ON: Urine-Other: NONE SEEN

## 2011-01-01 LAB — LITHIUM LEVEL: Lithium Lvl: 0.29 — ABNORMAL LOW

## 2011-01-03 LAB — BASIC METABOLIC PANEL
BUN: 3 — ABNORMAL LOW
CO2: 24
Calcium: 10.3
Chloride: 109
Creatinine, Ser: 0.78
GFR calc Af Amer: >60
GFR calc non Af Amer: >60
Glucose, Bld: 86
Potassium: 4.1
Sodium: 137

## 2011-01-03 LAB — URINALYSIS, ROUTINE W REFLEX MICROSCOPIC
Bilirubin Urine: NEGATIVE
Glucose, UA: NEGATIVE
Hgb urine dipstick: NEGATIVE
Ketones, ur: NEGATIVE
Nitrite: NEGATIVE
Protein, ur: NEGATIVE
Specific Gravity, Urine: 1.009
Urobilinogen, UA: 0.2
pH: 6.5

## 2011-01-03 LAB — CBC
HCT: 34.6 — ABNORMAL LOW
Hemoglobin: 11.2 — ABNORMAL LOW
MCHC: 32.3
MCV: 97.6
Platelets: 520 — ABNORMAL HIGH
RBC: 3.55 — ABNORMAL LOW
RDW: 14.7 — ABNORMAL HIGH
WBC: 6.5

## 2011-01-03 LAB — DIFFERENTIAL
Basophils Absolute: 0
Basophils Relative: 1
Eosinophils Absolute: 0.2
Eosinophils Relative: 3
Lymphocytes Relative: 17
Lymphs Abs: 1.1
Monocytes Absolute: 0.5
Monocytes Relative: 8
Neutro Abs: 4.6
Neutrophils Relative %: 71

## 2011-01-03 LAB — ETHANOL: Alcohol, Ethyl (B): <5

## 2011-01-03 LAB — RAPID URINE DRUG SCREEN, HOSP PERFORMED
Amphetamines: NOT DETECTED
Barbiturates: NOT DETECTED
Benzodiazepines: NOT DETECTED
Cocaine: NOT DETECTED
Opiates: NOT DETECTED
Tetrahydrocannabinol: NOT DETECTED

## 2011-08-22 ENCOUNTER — Emergency Department (HOSPITAL_COMMUNITY)
Admission: EM | Admit: 2011-08-22 | Discharge: 2011-08-22 | Disposition: A | Discharge status: Home / Self Care | Payer: Medicaid Other | Attending: Emergency Medicine | Admitting: Emergency Medicine

## 2011-08-22 ENCOUNTER — Encounter (HOSPITAL_COMMUNITY): Payer: Self-pay | Admitting: Family Medicine

## 2011-08-22 DIAGNOSIS — F209 Schizophrenia, unspecified: Secondary | ICD-10-CM | POA: Insufficient documentation

## 2011-08-22 DIAGNOSIS — R531 Weakness: Secondary | ICD-10-CM

## 2011-08-22 DIAGNOSIS — E871 Hypo-osmolality and hyponatremia: Secondary | ICD-10-CM | POA: Insufficient documentation

## 2011-08-22 DIAGNOSIS — R5381 Other malaise: Secondary | ICD-10-CM | POA: Insufficient documentation

## 2011-08-22 DIAGNOSIS — R5383 Other fatigue: Secondary | ICD-10-CM | POA: Insufficient documentation

## 2011-08-22 DIAGNOSIS — K5090 Crohn's disease, unspecified, without complications: Secondary | ICD-10-CM | POA: Insufficient documentation

## 2011-08-22 HISTORY — DX: Schizophrenia, unspecified: F20.9

## 2011-08-22 HISTORY — DX: Crohn's disease, unspecified, without complications: K50.90

## 2011-08-22 LAB — DIFFERENTIAL
Eosinophils Absolute: 0.1 10*3/uL (ref 0.0–0.7)
Eosinophils Relative: 2 % (ref 0–5)
Lymphocytes Relative: 36 % (ref 12–46)
Lymphs Abs: 1.8 10*3/uL (ref 0.7–4.0)
Monocytes Absolute: 0.6 10*3/uL (ref 0.1–1.0)
Monocytes Relative: 11 % (ref 3–12)

## 2011-08-22 LAB — BASIC METABOLIC PANEL
CO2: 24 mEq/L (ref 19–32)
Calcium: 10.4 mg/dL (ref 8.4–10.5)
Chloride: 90 mEq/L — ABNORMAL LOW (ref 96–112)
Creatinine, Ser: 0.75 mg/dL (ref 0.50–1.35)
Glucose, Bld: 79 mg/dL (ref 70–99)
Sodium: 123 mEq/L — ABNORMAL LOW (ref 135–145)

## 2011-08-22 LAB — CBC
HCT: 33.6 % — ABNORMAL LOW (ref 39.0–52.0)
MCH: 33.1 pg (ref 26.0–34.0)
MCV: 94.4 fL (ref 78.0–100.0)
Platelets: 315 10*3/uL (ref 150–400)
RBC: 3.56 MIL/uL — ABNORMAL LOW (ref 4.22–5.81)

## 2011-08-22 LAB — VALPROIC ACID LEVEL: Valproic Acid Lvl: 60.9 ug/mL (ref 50.0–100.0)

## 2011-08-22 MED ORDER — SODIUM CHLORIDE 0.9 % IV BOLUS (SEPSIS): 1000.0000 mL | Freq: Once | INTRAVENOUS | Status: DC

## 2011-08-22 NOTE — ED Notes (Signed)
Pt reports having nausea but no vomiting and weakness starting yesterday. States he wants to get off of his "shot" because he is having blurred vision. Denies any pain. NAD noted at this time.

## 2011-08-22 NOTE — ED Notes (Signed)
Per EMS: Pt from [facility]. Reports received a shot of haldol yesterday and today reports having knee weakness and feels jittery. Pt has hx of schizophrenia.

## 2011-08-22 NOTE — ED Provider Notes (Signed)
History     CSN: [id number]  Arrival date & time 08/22/11  1250   First MD Initiated Contact with Patient 08/22/11 1306      Chief Complaint  Patient presents with  . Extremity Weakness    (Consider location/radiation/quality/duration/timing/severity/associated sxs/prior treatment) HPI History from patient. 51 year old male with past medical history of schizophrenia presents with complaint of feeling jittery and weakness in his lower extremities. He is currently taking Haldol and received an injection for this yesterday. Symptoms started after receiving his injection. States he feels as if his legs are a little bit shaky and are going to give out on him. He denies any weakness in his arms, visual changes, nausea, vomiting, dizziness, difficulty speaking. He states he's been on and off of the Haldol for approximately the last 30 years. States "I want to go off it because I feel like it's making me have blurry vision." States that "I'm here today because I want you to take me off that medication." His psychiatrist is Dr. Ladona Ridgel. He has not contacted his psychiatrist about this.  Pt states that he typically does not have much of an appetite and has not been eating/drinking much recently.  Past Medical History  Diagnosis Date  . Schizophrenia   . Crohn's disease     History reviewed. No pertinent past surgical history.  History reviewed. No pertinent family history.  History  Substance Use Topics  . Smoking status: Not on file  . Smokeless tobacco: Not on file  . Alcohol Use:       Review of Systems  Constitutional: Negative for fever, chills, activity change and appetite change.  Respiratory: Negative for cough and shortness of breath.   Cardiovascular: Negative for chest pain and palpitations.  Gastrointestinal: Negative for nausea, vomiting and diarrhea.  Neurological: Positive for weakness. Negative for seizures, syncope and light-headedness.  All other systems reviewed  and are negative.    Allergies  Lithium and Thorazine  Home Medications   Current Outpatient Rx  Name Route Sig Dispense Refill  . BENZTROPINE MESYLATE 1 MG PO TABS Oral Take 1 mg by mouth 2 (two) times daily.    Marland Kitchen DIVALPROEX SODIUM ER 500 MG PO TB24 Oral Take 500 mg by mouth daily.    Marland Kitchen FERROUS SULFATE 325 (65 FE) MG PO TABS Oral Take 325 mg by mouth daily with breakfast.    . HALOPERIDOL 5 MG PO TABS Oral Take 5 mg by mouth 2 (two) times daily.    Marland Kitchen OLANZAPINE 20 MG PO TABS Oral Take 20 mg by mouth at bedtime.      BP 115/77  Pulse 94  Temp(Src) 98.1 F (36.7 C) (Oral)  Resp 18  SpO2 100%  Physical Exam  Nursing note and vitals reviewed. Constitutional: He is oriented to person, place, and time. He appears well-developed and well-nourished. No distress.  HENT:  Head: Normocephalic and atraumatic.  Right Ear: External ear normal.  Left Ear: External ear normal.  Mouth/Throat: Oropharynx is clear and moist. No oropharyngeal exudate.  Eyes: EOM are normal. Pupils are equal, round, and reactive to light.       Vision 20/20 to L and R eye respectively  Neck: Normal range of motion.  Cardiovascular: Normal rate, regular rhythm and normal heart sounds.   Pulmonary/Chest: Effort normal and breath sounds normal. He exhibits no tenderness.  Abdominal: Soft. Bowel sounds are normal. There is no tenderness. There is no rebound and no guarding.  Musculoskeletal: Normal range of motion.  Neurological: He is alert and oriented to person, place, and time. No cranial nerve deficit. He exhibits normal muscle tone. Coordination normal.       5/5 and equal strength on testing of lower and upper ext bilat. Clear speech. Grips equal. Negative pronator drift.  Skin: Skin is warm and dry. He is not diaphoretic.  Psychiatric: He has a normal mood and affect.       Pt with normal affect and mentation at this time.    ED Course  Procedures (including critical care time)  Labs Reviewed    BASIC METABOLIC PANEL - Abnormal; Notable for the following:    Sodium 123 (*)    Chloride 90 (*)    BUN 4 (*)    All other components within normal limits  CBC - Abnormal; Notable for the following:    RBC 3.56 (*)    Hemoglobin 11.8 (*)    HCT 33.6 (*)    All other components within normal limits  DIFFERENTIAL  VALPROIC ACID LEVEL   No results found.   1. Hyponatremia   2. Weakness       MDM  Pt states he is feeling weak after receiving his Haldol yesterday. Neuro exam is reassuring with equal strength bilat. Pt is hyponatremic and hypochloremic, which may be attributable to decreased PO intake. Recommended that the patient receive IVF bolus for this which he refused stating "I'm feeling better." I explained to pt the risks of this, including seizures, but he refused. Although pt does have underlying psychiatric condition, he appears to be mentating appropriately at this time. Discussed with Dr. Lynelle Doctor - pt was advised to drink a large bottle of Gatorade at home this evening and another large bottle tomorrow during the day. He is to follow up with his PCP for a recheck. He is aware that he is welcome to return to the ED at any time for treatment.     Grant Fontana, [place] 08/22/11 1730

## 2011-08-22 NOTE — Discharge Instructions (Signed)
Your sodium was low today. This raises the risk of you having seizures. It is very important for you to eat and drink as normal to bring this level up. You need to drink a big bottle of Gatorade this evening and another bottle tomorrow to help bring up your sodium. Please call Dr. Pilar Grammes office to make a follow up appointment to get your level rechecked. You are welcome to return to the ER at any time for treatment.  Hyponatremia  Hyponatremia is when the salt (sodium) in your blood is low. When salt becomes low, your cells take in extra water and puff up (swell). The puffiness can happen in the whole body. It mostly affects the brain and is very serious.  HOME CARE  Only take medicine as told by your doctor.   Follow any diet instructions you were given. This includes limiting how much fluid you drink.   Keep all doctor visits for tests as told.   Avoid alcohol and drugs.  GET HELP RIGHT AWAY IF:  You start to twitch and shake (seize).   You pass out (faint).   You continue to have watery poop (diarrhea) or you throw up (vomit).   You feel sick to your stomach (nauseous).   You are tired (fatigued), have a headache, are confused, or feel weak.   Your problems that first brought you to the doctor come back.   You have trouble following your diet instructions.  MAKE SURE YOU:   Understand these instructions.   Will watch your condition.   Will get help right away if you are not doing well or get worse.  Document Released: 11/14/2010 Document Revised: 02/21/2011 Document Reviewed: 11/14/2010 [facility] Patient Information 2012 [place], [place].

## 2011-08-22 NOTE — ED Notes (Signed)
Pt informed that he needs IV fluids, pt states he is refusing any further treatment.  PA made aware and will go to bedside to speak with pt.

## 2011-08-23 NOTE — ED Provider Notes (Signed)
Medical screening examination/treatment/procedure(s) were performed by non-physician practitioner and as supervising physician I was immediately available for consultation/collaboration. Tylea Hise, MD, FACEP   Nikolai Wilczak L Melven Stockard, MD 08/23/11 1649 

## 2011-11-07 ENCOUNTER — Emergency Department (HOSPITAL_COMMUNITY)
Admission: EM | Admit: 2011-11-07 | Discharge: 2011-11-09 | Disposition: A | Discharge status: Home / Self Care | Payer: Medicaid Other | Attending: Emergency Medicine | Admitting: Emergency Medicine

## 2011-11-07 ENCOUNTER — Encounter (HOSPITAL_COMMUNITY): Payer: Self-pay | Admitting: Emergency Medicine

## 2011-11-07 DIAGNOSIS — F209 Schizophrenia, unspecified: Secondary | ICD-10-CM | POA: Insufficient documentation

## 2011-11-07 DIAGNOSIS — R000 Tachycardia, unspecified: Secondary | ICD-10-CM | POA: Insufficient documentation

## 2011-11-07 LAB — CBC WITH DIFFERENTIAL/PLATELET
Basophils Absolute: 0.1 10*3/uL (ref 0.0–0.1)
Basophils Relative: 1 % (ref 0–1)
Eosinophils Relative: 1 % (ref 0–5)
HCT: 36.5 % — ABNORMAL LOW (ref 39.0–52.0)
Hemoglobin: 12.6 g/dL — ABNORMAL LOW (ref 13.0–17.0)
MCHC: 34.5 g/dL (ref 30.0–36.0)
MCV: 97.9 fL (ref 78.0–100.0)
Monocytes Absolute: 0.5 10*3/uL (ref 0.1–1.0)
Monocytes Relative: 9 % (ref 3–12)
Neutro Abs: 3.4 10*3/uL (ref 1.7–7.7)
RDW: 12.4 % (ref 11.5–15.5)

## 2011-11-07 LAB — COMPREHENSIVE METABOLIC PANEL
ALT: 14 U/L (ref 0–53)
AST: 16 U/L (ref 0–37)
Albumin: 3.8 g/dL (ref 3.5–5.2)
Alkaline Phosphatase: 102 U/L (ref 39–117)
Calcium: 11.4 mg/dL — ABNORMAL HIGH (ref 8.4–10.5)
GFR calc Af Amer: >90 mL/min (ref >90)
Potassium: 4.1 mEq/L (ref 3.5–5.1)
Sodium: 133 mEq/L — ABNORMAL LOW (ref 135–145)
Total Protein: 8 g/dL (ref 6.0–8.3)

## 2011-11-07 LAB — RAPID URINE DRUG SCREEN, HOSP PERFORMED
Amphetamines: NOT DETECTED
Barbiturates: NOT DETECTED
Benzodiazepines: NOT DETECTED
Tetrahydrocannabinol: NOT DETECTED

## 2011-11-07 MED ORDER — IBUPROFEN 600 MG PO TABS: 600.0000 mg | ORAL_TABLET | Freq: Three times a day (TID) | ORAL | Status: DC | PRN

## 2011-11-07 MED ORDER — ONDANSETRON HCL 4 MG PO TABS: 4.0000 mg | ORAL_TABLET | Freq: Three times a day (TID) | ORAL | Status: DC | PRN

## 2011-11-07 MED ORDER — ACETAMINOPHEN 325 MG PO TABS: 650.0000 mg | ORAL_TABLET | ORAL | Status: DC | PRN

## 2011-11-07 NOTE — ED Notes (Signed)
States " has not threatened anyone, do not want to hurt anyone or myself." trying to move out of group home and go to another in [place]. Here w IVC papers- in [employer] custody

## 2011-11-07 NOTE — ED Notes (Signed)
Patient changed into scrubs. Security wanded and searched pt belongings.

## 2011-11-07 NOTE — ED Notes (Signed)
Pt was up to bathroom 
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
## 2011-11-08 MED ORDER — HALOPERIDOL 5 MG PO TABS
5.0000 mg | ORAL_TABLET | Freq: Two times a day (BID) | ORAL | Status: DC
  Administered 2011-11-08 – 2011-11-09 (×3): 5 mg via ORAL
  Filled 2011-11-08 (×3): qty 1

## 2011-11-08 MED ORDER — OLANZAPINE 5 MG PO TABS
20.0000 mg | ORAL_TABLET | Freq: Every day | ORAL | Status: DC
  Administered 2011-11-08: 20 mg via ORAL
  Filled 2011-11-08: qty 4

## 2011-11-08 MED ORDER — DIVALPROEX SODIUM ER 500 MG PO TB24
500.0000 mg | ORAL_TABLET | Freq: Every evening | ORAL | Status: DC
  Administered 2011-11-08: 500 mg via ORAL
  Filled 2011-11-08 (×2): qty 1

## 2011-11-08 MED ORDER — FERROUS SULFATE 325 (65 FE) MG PO TABS
325.0000 mg | ORAL_TABLET | Freq: Every day | ORAL | Status: DC
  Administered 2011-11-08 – 2011-11-09 (×2): 325 mg via ORAL
  Filled 2011-11-08 (×3): qty 1

## 2011-11-08 MED ORDER — BENZTROPINE MESYLATE 1 MG PO TABS
1.0000 mg | ORAL_TABLET | Freq: Two times a day (BID) | ORAL | Status: DC
  Administered 2011-11-08 – 2011-11-09 (×3): 1 mg via ORAL
  Filled 2011-11-08 (×3): qty 1

## 2011-11-08 NOTE — ED Notes (Addendum)
Facility Director [facility] [phone] Call sister first(Guardian) Jeff Long [phone](C)  [phone](H)  Facility- [facility] [address]  [place] [address] [phone]

## 2011-11-08 NOTE — ED Notes (Signed)
Up to bathroom

## 2011-11-08 NOTE — Progress Notes (Signed)
Pt has completed his tele-psych consult which has reversed his IVC and cleared him for discharge. Pt is from [facility] [phone]). According to Darel Hong, staff member at [employer], the pt cannot return to the facility until the Administrator, Roderic Scarce, agrees to pt's return. Onalee Hua is unavailable this evening with no contact number. According to Lennox Grumbles will be available at the ALF at 7:30am on 11/09/11. Oncoming ACT/CSW to contact Onalee Hua to confirm that the pt is able to return to the ALF.
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
## 2011-11-09 NOTE — ED Notes (Signed)
Pt reports he's doing very well and he's ready to go back to his group home now. He still won't say what exactly the spiritual voices tell him but that it's never anything bad.

## 2011-11-09 NOTE — ED Notes (Signed)
Pt is being discharged back to his group home per MD orders IVC papers have been rescinded via telepsych. Pt will transport via police or [facility].
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
## 2011-11-09 NOTE — ED Notes (Signed)
[facility] contacted at [phone] to take pt from here to [facility] [address]. Per Allred, at [facility] they will dispatch someone to pick him up from [facility] ED.

## 2011-11-09 NOTE — ED Notes (Signed)
Pt was picked up by [facility] officer, just walked them both out. One personal belongings bag was returned to pt.

## 2011-11-09 NOTE — ED Notes (Addendum)
CSW consulted with CSW Director Hope Rife who informed writer that since pt is medically and psychiatrically cleared pt  may return back to his home or place of residence (group home) due to returning to his baseline and reaching stabilization. Director reported to Clinical research associate that the facility must provide a 30 day notice to the legal guardian if pt must be discharge. CSW Director reported that it is imperative for the safety of the pt to have an appropriate discharge plan organized by pt's legal guardian, his SW, and facility prior to any discharge. Director verbalized to Clinical research associate to encourage MD to relay his concerns about pt's care and required discharge plan with staff member Roderic Scarce) prior to pt's transfer back to group home. CSW informed and notified MD of CSW Director's recommendation. MD is agreeable and will contact Roderic Scarce [phone] prior to transport.   Janann Colonel., MSW, [facility] Clinical Social Worker [phone]

## 2011-11-09 NOTE — ED Notes (Signed)
Up to bathroom

## 2011-11-09 NOTE — ED Notes (Signed)
Pt eating breakfast 

## 2011-11-09 NOTE — ED Notes (Signed)
Pt up to bathroom.
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
## 2011-11-09 NOTE — ED Notes (Signed)
Pt came to nursing station and asked for a coke.

## 2011-11-09 NOTE — ED Provider Notes (Addendum)
Pt resting comfortably. Nad. Discussed w act team - sw to facilitate return to group home this morning.   Suzi Roots, MD 11/09/11 0802  Pt has remained calm and cooperative today. No agitation or aggressive behavior. Have discussed w act team and social work -  They have further discussed with group home staff member, and with our social work Customer service manager who advises d/c back to his group home.  sw indicates pt has a sw at the group home who can work pt and his group home if alternative group home placement is decided upon, family also indicating to our act/sw that they are willing to work with pt, his sw, and his group home in this regard. Pt has been released from ivc and deemed psychiatrically stable for d/c by psychiatry consult.      Suzi Roots, MD 11/09/11 1415

## 2012-02-03 LAB — URINALYSIS, COMPLETE
Bacteria: NONE SEEN
Glucose,UR: NEGATIVE mg/dL (ref 0–75)
Leukocyte Esterase: NEGATIVE
Nitrite: NEGATIVE
Protein: NEGATIVE
RBC,UR: 1 /HPF (ref 0–5)
Specific Gravity: 1.011 (ref 1.003–1.030)
WBC UR: <1 /HPF (ref 0–5)

## 2012-02-03 LAB — CBC
MCH: 33.7 pg (ref 26.0–34.0)
MCV: 98 fL (ref 80–100)
Platelet: 341 10*3/uL (ref 150–440)
RDW: 12.9 % (ref 11.5–14.5)

## 2012-02-03 LAB — COMPREHENSIVE METABOLIC PANEL
Anion Gap: 5 — ABNORMAL LOW (ref 7–16)
BUN: 5 mg/dL — ABNORMAL LOW (ref 7–18)
Calcium, Total: 10 mg/dL (ref 8.5–10.1)
Chloride: 97 mmol/L — ABNORMAL LOW (ref 98–107)
Co2: 25 mmol/L (ref 21–32)
EGFR (African American): >60
EGFR (Non-African Amer.): >60
Potassium: 4.4 mmol/L (ref 3.5–5.1)
SGOT(AST): 19 U/L (ref 15–37)
SGPT (ALT): 17 U/L (ref 12–78)

## 2012-02-03 LAB — DRUG SCREEN, URINE
Amphetamines, Ur Screen: NEGATIVE (ref ?–1000)
Barbiturates, Ur Screen: NEGATIVE (ref ?–200)
Benzodiazepine, Ur Scrn: NEGATIVE (ref ?–200)
MDMA (Ecstasy)Ur Screen: NEGATIVE (ref ?–500)
Methadone, Ur Screen: NEGATIVE (ref ?–300)
Tricyclic, Ur Screen: NEGATIVE (ref ?–1000)

## 2012-02-03 LAB — ETHANOL: Ethanol %: <0.003 % (ref 0.000–0.080)

## 2012-02-04 LAB — VALPROIC ACID LEVEL: Valproic Acid: 24 ug/mL — ABNORMAL LOW

## 2012-02-05 LAB — CBC WITH DIFFERENTIAL/PLATELET
Basophil #: 0.1 10*3/uL (ref 0.0–0.1)
Eosinophil %: 2.6 %
Lymphocyte #: 1.7 10*3/uL (ref 1.0–3.6)
Lymphocyte %: 27.2 %
Monocyte %: 12.9 %
Neutrophil %: 56.4 %
Platelet: 389 10*3/uL (ref 150–440)
RBC: 3.65 10*6/uL — ABNORMAL LOW (ref 4.40–5.90)
RDW: 13.1 % (ref 11.5–14.5)
WBC: 6.3 10*3/uL (ref 3.8–10.6)

## 2012-02-05 LAB — URINALYSIS, COMPLETE
Bacteria: NONE SEEN
Glucose,UR: NEGATIVE mg/dL (ref 0–75)
Ketone: NEGATIVE
Leukocyte Esterase: NEGATIVE
Nitrite: NEGATIVE
Specific Gravity: 1.006 (ref 1.003–1.030)

## 2012-02-06 LAB — COMPREHENSIVE METABOLIC PANEL
Alkaline Phosphatase: 124 U/L (ref 50–136)
Bilirubin,Total: 0.4 mg/dL (ref 0.2–1.0)
Chloride: 100 mmol/L (ref 98–107)
Co2: 26 mmol/L (ref 21–32)
Creatinine: 0.96 mg/dL (ref 0.60–1.30)
EGFR (African American): >60
EGFR (Non-African Amer.): >60
Osmolality: 258 (ref 275–301)
Sodium: 130 mmol/L — ABNORMAL LOW (ref 136–145)

## 2012-02-06 LAB — VALPROIC ACID LEVEL: Valproic Acid: 80 ug/mL

## 2012-02-07 ENCOUNTER — Inpatient Hospital Stay: Payer: Self-pay | Admitting: Psychiatry

## 2012-03-06 ENCOUNTER — Emergency Department: Payer: Self-pay | Admitting: Emergency Medicine

## 2012-04-17 ENCOUNTER — Emergency Department: Payer: Self-pay | Admitting: Emergency Medicine

## 2012-06-03 IMAGING — CT CT ABD-PELV W/ CM
4 of 5 series · 12 of 46 positions shown, 18 images · IV contrast (water/omni  & 100ml omni 300)
Comparison: None.

CT CHEST

CLINICAL DATA: Chest pain.  Abdominal pain.  Nausea and vomiting.

CT CHEST, ABDOMEN AND PELVIS WITH CONTRAST
TECHNIQUE: Multidetector CT imaging of the chest, abdomen and
pelvis was performed following the standard protocol during bolus
administration of intravenous contrast.
Contrast: 100 ml Omnipaque-[id number].

[Series 2: chest/abd/pelvis · axial · 0.74mm/px · z∈[-582,-282]mm · 5 of 135 slices shown]
[im 15/135  soft-tissue]
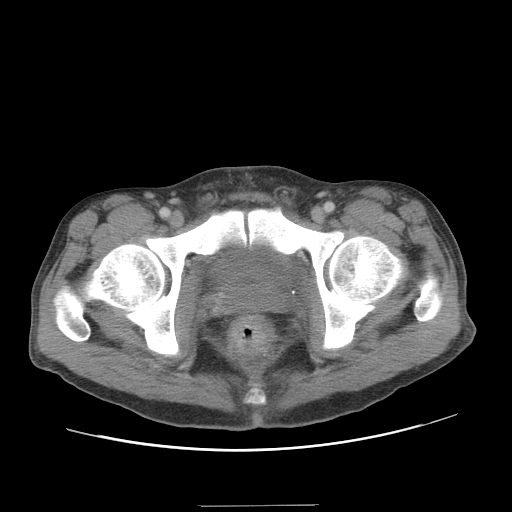
[im 30/135  soft-tissue]
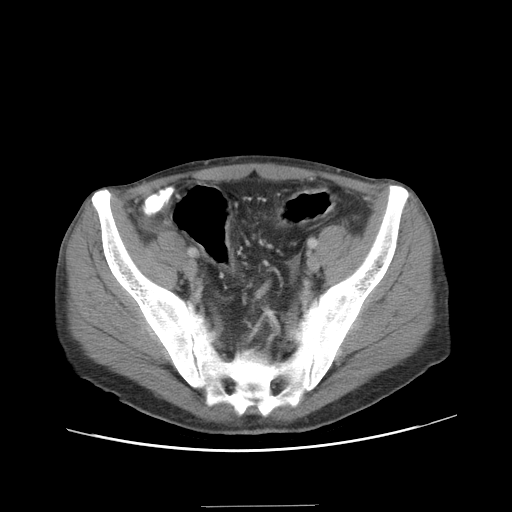
[im 45/135  soft-tissue]
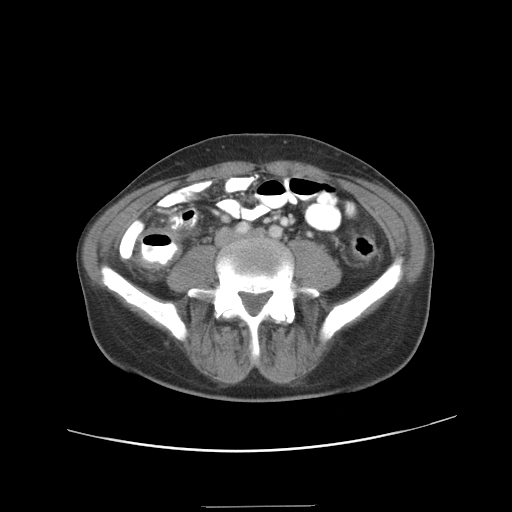
[im 60/135  soft-tissue]
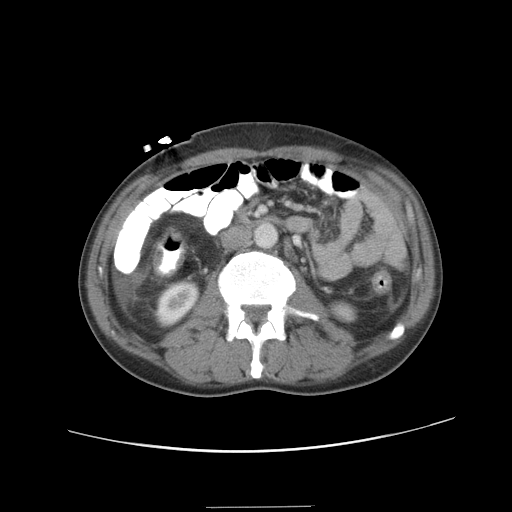
[im 75/135  soft-tissue]
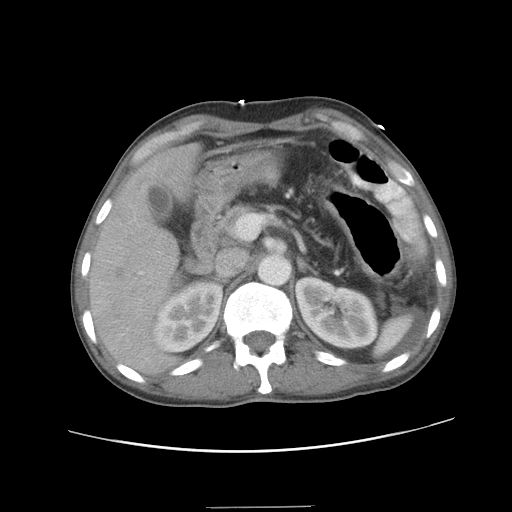

[Series 5: renal delays · axial · 0.66mm/px · z∈[-352,-272]mm · 3 of 32 slices shown, 7 images]
[im 8/32  soft-tissue]
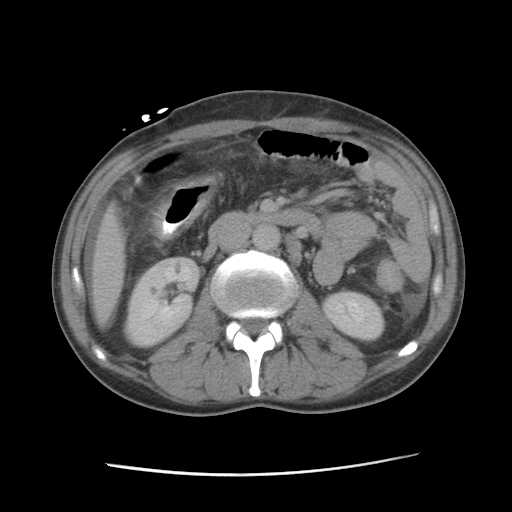
[im 8/32  lung]
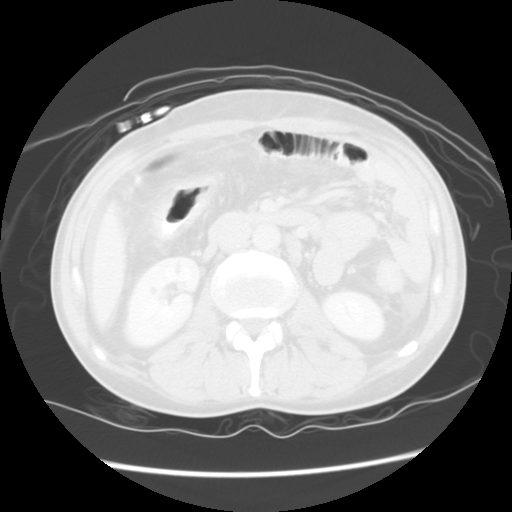
[im 8/32  bone]
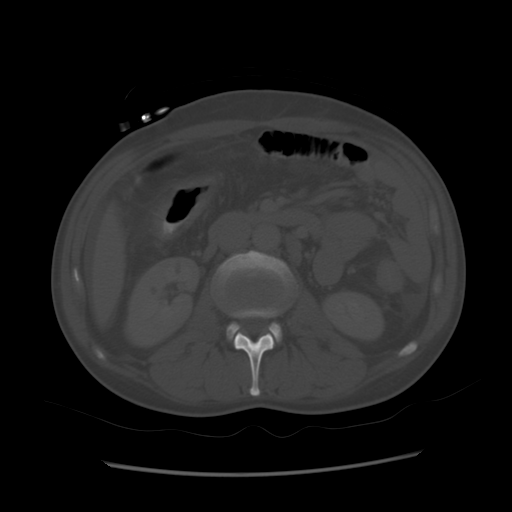
[im 16/32  soft-tissue]
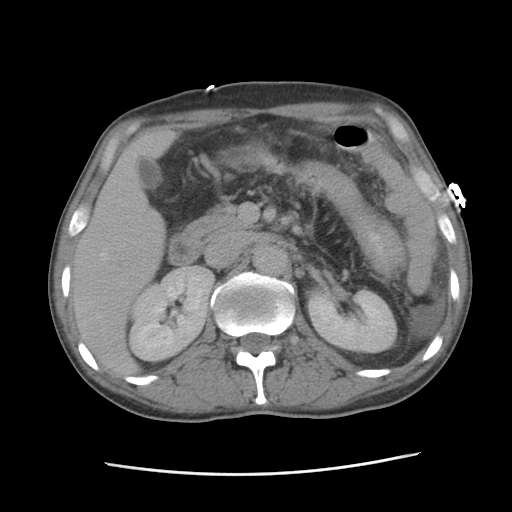
[im 16/32  lung]
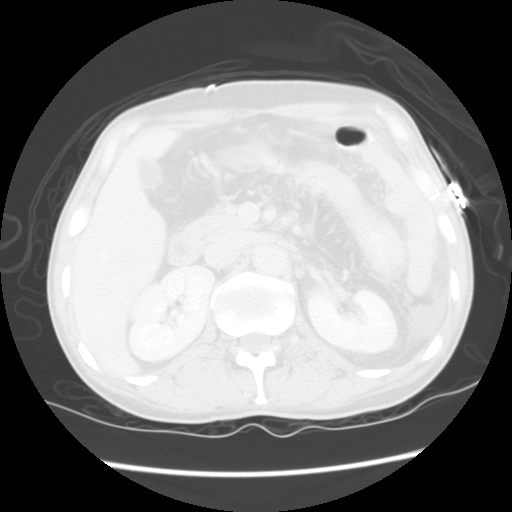
[im 24/32  soft-tissue]
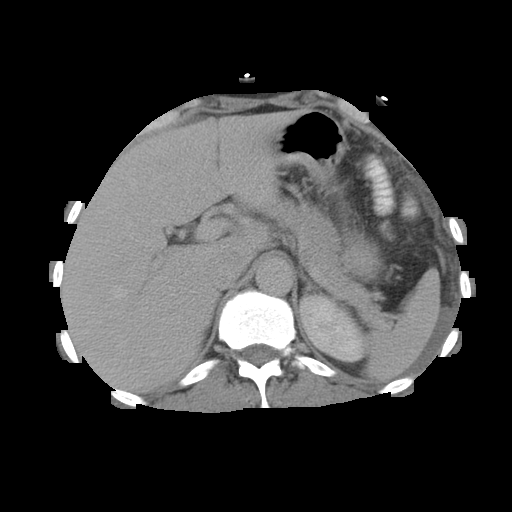
[im 24/32  lung]
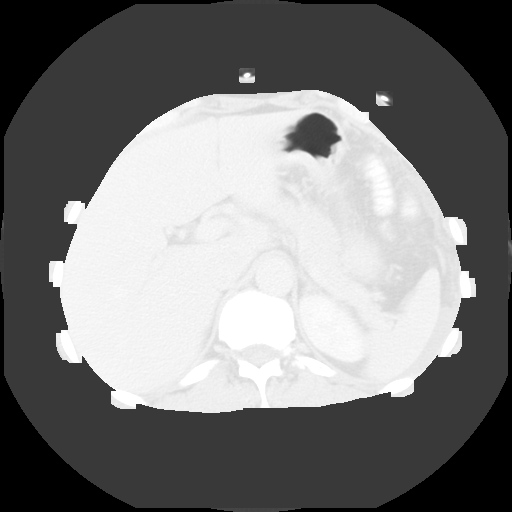

[Series 400: reformatted · sagittal · 1.33mm/px · 1 of 121 slices shown, 2 images (1 of 2)]
[im 41/121  soft-tissue]
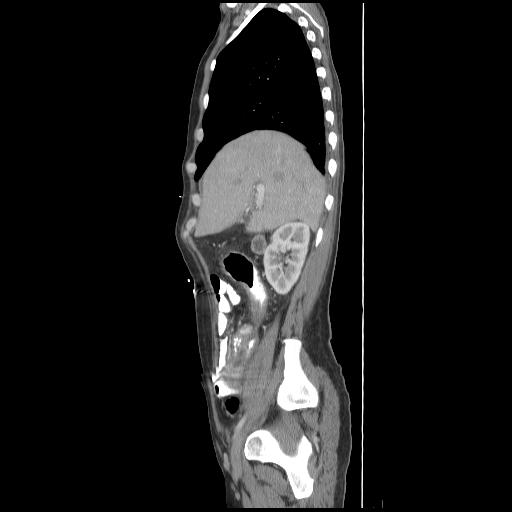
[im 41/121  bone]
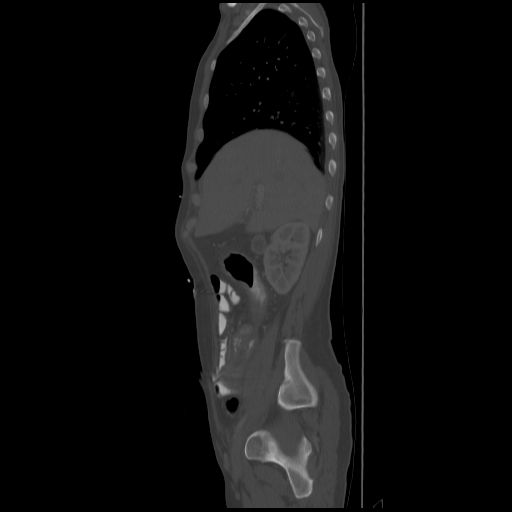

[Series 401: reformatted · coronal · 1.33mm/px · 3 of 97 slices shown, 4 images (2 of 2)]
[im 33/97  soft-tissue]
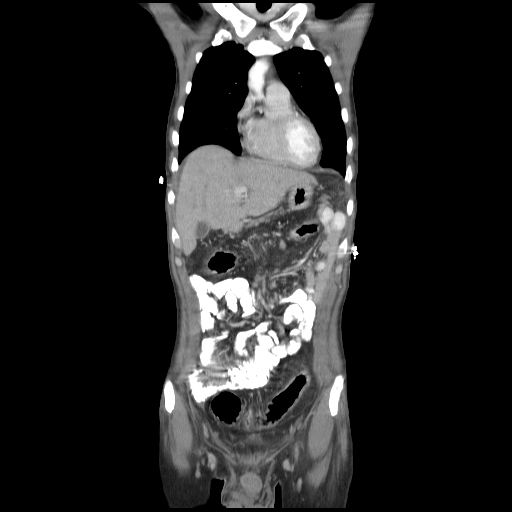
[im 43/97  soft-tissue]
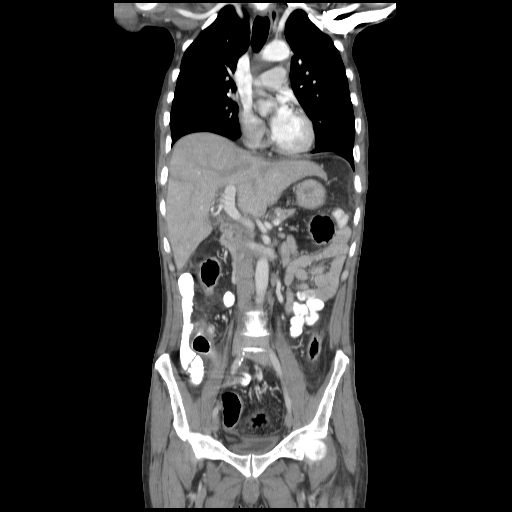
[im 43/97  bone]
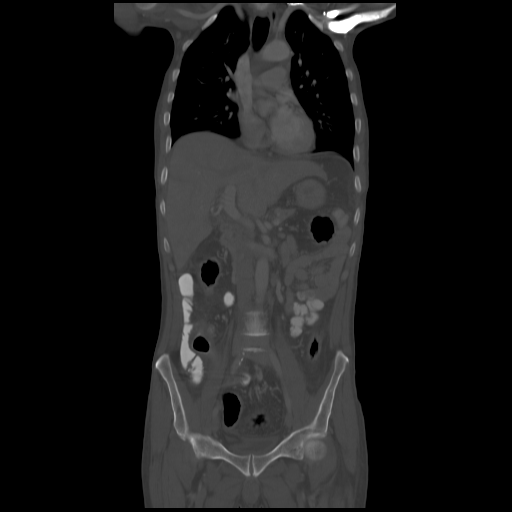
[im 54/97  soft-tissue]
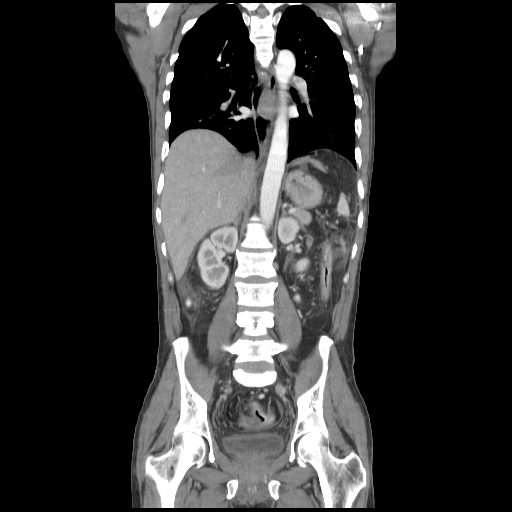

[12 of 46 positions shown; findings below may reference images not displayed]

FINDINGS: Dependent atelectasis is present within the lungs.  Lung
apices are excluded.  No axillary adenopathy.  Four vessel aortic
arch.  No acute aortic abnormality.  Trace bilateral pleural
effusions.  No airspace disease or consolidation.
IMPRESSION: Trace bilateral pleural effusions.  No acute cardiopulmonary
disease.

CT ABDOMEN AND PELVIS
FINDINGS: Heterogeneous attenuation of the liver on portal venous
phase imaging suggests congestive change.  Sub centimeter low
density lesion is present in the anterior right hepatic lobe on
image #4, compatible with cyst.  There is a vague low density
lesion present nearby on image #8, of uncertain significance.  The
gallbladder appears within normal limits.

Small volume ascites.  There is no free air.  No perforation of
bowel.  Normal renal enhancement and excretion.  Right renal cysts
are present.  Abdominal vasculature is within normal limits.

There is diffuse colitis and inflammation of the terminal ileum.
Colitis extends to the rectum.

Urinary bladder is decompressed.  Small amount of ascites in the
anatomic pelvis.  The appendix is inflamed with right lower
quadrant phlegmon.  There is no pneumatosis of the colon.  Bones
appear within normal limits. The pancreas appears within normal
limits.  Common bile duct normal.  Spleen unremarkable.
IMPRESSION: 1.  Diffuse colitis with inflammation of the terminal ileum and
appendix.  Findings most compatible with inflammatory bowel
disease. Infection including C difficile should be considered.
Ischemia unlikely based on distribution and relative lack of
atherosclerotic changes.
2.  Right renal and hepatic cysts.
3.  Vague low density lesion in the right hepatic lobe adjacent to
a cyst of uncertain clinical significance.  There is no distortion
of vessels and this may represent a focal area of fatty
infiltration.
4.  Small amount of reactive ascites.

## 2012-07-27 ENCOUNTER — Emergency Department: Payer: Self-pay | Admitting: Emergency Medicine

## 2012-07-27 LAB — COMPREHENSIVE METABOLIC PANEL
Anion Gap: 3 — ABNORMAL LOW (ref 7–16)
BUN: 5 mg/dL — ABNORMAL LOW (ref 7–18)
Bilirubin,Total: 0.2 mg/dL (ref 0.2–1.0)
Chloride: 104 mmol/L (ref 98–107)
EGFR (African American): >60
SGOT(AST): 16 U/L (ref 15–37)
Total Protein: 7.3 g/dL (ref 6.4–8.2)

## 2012-07-27 LAB — ACETAMINOPHEN LEVEL: Acetaminophen: <2 ug/mL

## 2012-07-27 LAB — DRUG SCREEN, URINE
Barbiturates, Ur Screen: NEGATIVE (ref ?–200)
Benzodiazepine, Ur Scrn: NEGATIVE (ref ?–200)
Cannabinoid 50 Ng, Ur ~~LOC~~: NEGATIVE (ref ?–50)
Opiate, Ur Screen: NEGATIVE (ref ?–300)

## 2012-07-27 LAB — TSH: Thyroid Stimulating Horm: 0.61 u[IU]/mL

## 2012-07-27 LAB — ETHANOL: Ethanol %: <0.003 % (ref 0.000–0.080)

## 2012-07-27 LAB — SALICYLATE LEVEL: Salicylates, Serum: 4.9 mg/dL — ABNORMAL HIGH

## 2012-07-27 LAB — CBC
HGB: 10.2 g/dL — ABNORMAL LOW (ref 13.0–18.0)
MCHC: 33.5 g/dL (ref 32.0–36.0)
MCV: 97 fL (ref 80–100)
Platelet: 334 10*3/uL (ref 150–440)
RBC: 3.14 10*6/uL — ABNORMAL LOW (ref 4.40–5.90)
WBC: 5.4 10*3/uL (ref 3.8–10.6)

## 2012-08-07 LAB — COMPREHENSIVE METABOLIC PANEL
Albumin: 3.4 g/dL (ref 3.4–5.0)
Anion Gap: 7 (ref 7–16)
BUN: 6 mg/dL — ABNORMAL LOW (ref 7–18)
Bilirubin,Total: 0.3 mg/dL (ref 0.2–1.0)
Calcium, Total: 10.1 mg/dL (ref 8.5–10.1)
Co2: 25 mmol/L (ref 21–32)
Creatinine: 0.77 mg/dL (ref 0.60–1.30)
EGFR (Non-African Amer.): >60
Glucose: 79 mg/dL (ref 65–99)
Potassium: 4 mmol/L (ref 3.5–5.1)
SGOT(AST): 18 U/L (ref 15–37)
SGPT (ALT): 12 U/L (ref 12–78)
Sodium: 119 mmol/L — CL (ref 136–145)

## 2012-08-07 LAB — CBC
MCH: 32.9 pg (ref 26.0–34.0)
MCHC: 34.4 g/dL (ref 32.0–36.0)
MCV: 96 fL (ref 80–100)
RDW: 13.9 % (ref 11.5–14.5)
WBC: 6.6 10*3/uL (ref 3.8–10.6)

## 2012-08-07 LAB — URINALYSIS, COMPLETE
Bacteria: NONE SEEN
Bilirubin,UR: NEGATIVE
Blood: NEGATIVE
Ketone: NEGATIVE
Nitrite: NEGATIVE
Protein: NEGATIVE
WBC UR: NONE SEEN /HPF (ref 0–5)

## 2012-08-07 LAB — TSH: Thyroid Stimulating Horm: 1.32 u[IU]/mL

## 2012-08-07 LAB — DRUG SCREEN, URINE
Amphetamines, Ur Screen: NEGATIVE (ref ?–1000)
Barbiturates, Ur Screen: NEGATIVE (ref ?–200)
Benzodiazepine, Ur Scrn: NEGATIVE (ref ?–200)
MDMA (Ecstasy)Ur Screen: NEGATIVE (ref ?–500)
Methadone, Ur Screen: NEGATIVE (ref ?–300)
Phencyclidine (PCP) Ur S: NEGATIVE (ref ?–25)

## 2012-08-07 LAB — ETHANOL
Ethanol %: <0.003 % (ref 0.000–0.080)
Ethanol: <3 mg/dL

## 2012-08-07 LAB — VALPROIC ACID LEVEL: Valproic Acid: 84 ug/mL

## 2012-08-08 ENCOUNTER — Inpatient Hospital Stay: Payer: Self-pay | Admitting: Family Medicine

## 2012-08-08 LAB — SODIUM: Sodium: 136 mmol/L (ref 136–145)

## 2012-08-08 LAB — SODIUM, URINE, RANDOM: Sodium, Urine Random: 21 mmol/L (ref 20–110)

## 2012-08-08 LAB — BASIC METABOLIC PANEL
Chloride: 97 mmol/L — ABNORMAL LOW (ref 98–107)
Co2: 24 mmol/L (ref 21–32)
EGFR (African American): >60
EGFR (Non-African Amer.): >60
Potassium: 3.9 mmol/L (ref 3.5–5.1)
Sodium: 126 mmol/L — ABNORMAL LOW (ref 136–145)

## 2012-08-08 LAB — OSMOLALITY, URINE: Osmolality: 120 mOsm/kg

## 2012-08-21 ENCOUNTER — Emergency Department: Payer: Self-pay | Admitting: Emergency Medicine

## 2012-08-21 LAB — COMPREHENSIVE METABOLIC PANEL
Anion Gap: 7 (ref 7–16)
BUN: 3 mg/dL — ABNORMAL LOW (ref 7–18)
Bilirubin,Total: 0.4 mg/dL (ref 0.2–1.0)
Chloride: 97 mmol/L — ABNORMAL LOW (ref 98–107)
Creatinine: 0.71 mg/dL (ref 0.60–1.30)
EGFR (African American): >60
EGFR (Non-African Amer.): >60
Glucose: 79 mg/dL (ref 65–99)
Osmolality: 251 (ref 275–301)
Potassium: 4 mmol/L (ref 3.5–5.1)
SGOT(AST): 13 U/L — ABNORMAL LOW (ref 15–37)
SGPT (ALT): 9 U/L — ABNORMAL LOW (ref 12–78)
Sodium: 127 mmol/L — ABNORMAL LOW (ref 136–145)

## 2012-08-21 LAB — CBC
MCH: 33.2 pg (ref 26.0–34.0)
MCHC: 34.8 g/dL (ref 32.0–36.0)

## 2012-08-21 LAB — URINALYSIS, COMPLETE
Glucose,UR: NEGATIVE mg/dL (ref 0–75)
Ph: 7 (ref 4.5–8.0)
Protein: NEGATIVE
RBC,UR: NONE SEEN /HPF (ref 0–5)
Specific Gravity: 1.004 (ref 1.003–1.030)
WBC UR: <1 /HPF (ref 0–5)

## 2012-08-21 LAB — TSH: Thyroid Stimulating Horm: 0.84 u[IU]/mL

## 2012-08-21 LAB — DRUG SCREEN, URINE
Amphetamines, Ur Screen: NEGATIVE (ref ?–1000)
Barbiturates, Ur Screen: NEGATIVE (ref ?–200)
Benzodiazepine, Ur Scrn: NEGATIVE (ref ?–200)
Cocaine Metabolite,Ur ~~LOC~~: NEGATIVE (ref ?–300)
Methadone, Ur Screen: NEGATIVE (ref ?–300)
Opiate, Ur Screen: NEGATIVE (ref ?–300)

## 2012-08-21 LAB — ETHANOL
Ethanol %: <0.003 % (ref 0.000–0.080)
Ethanol: <3 mg/dL

## 2012-11-02 LAB — COMPREHENSIVE METABOLIC PANEL
Alkaline Phosphatase: 82 U/L (ref 50–136)
Anion Gap: 2 — ABNORMAL LOW (ref 7–16)
Bilirubin,Total: 0.3 mg/dL (ref 0.2–1.0)
Calcium, Total: 10 mg/dL (ref 8.5–10.1)
Chloride: 104 mmol/L (ref 98–107)
Creatinine: 1.04 mg/dL (ref 0.60–1.30)
EGFR (African American): >60
EGFR (Non-African Amer.): >60
Glucose: 76 mg/dL (ref 65–99)
Potassium: 3.8 mmol/L (ref 3.5–5.1)
Total Protein: 7.2 g/dL (ref 6.4–8.2)

## 2012-11-02 LAB — ETHANOL: Ethanol %: <0.003 % (ref 0.000–0.080)

## 2012-11-02 LAB — CBC
HCT: 26.8 % — ABNORMAL LOW (ref 40.0–52.0)
MCHC: 35.2 g/dL (ref 32.0–36.0)
MCV: 98 fL (ref 80–100)
Platelet: 320 10*3/uL (ref 150–440)
WBC: 4.7 10*3/uL (ref 3.8–10.6)

## 2012-11-02 LAB — URINALYSIS, COMPLETE
Blood: NEGATIVE
Nitrite: NEGATIVE
Protein: NEGATIVE
Specific Gravity: 1.005 (ref 1.003–1.030)
WBC UR: NONE SEEN /HPF (ref 0–5)

## 2012-11-02 LAB — TSH: Thyroid Stimulating Horm: 1.36 u[IU]/mL

## 2012-11-03 LAB — DRUG SCREEN, URINE
Amphetamines, Ur Screen: NEGATIVE (ref ?–1000)
Benzodiazepine, Ur Scrn: NEGATIVE (ref ?–200)
Cannabinoid 50 Ng, Ur ~~LOC~~: NEGATIVE (ref ?–50)
Phencyclidine (PCP) Ur S: NEGATIVE (ref ?–25)

## 2012-11-04 ENCOUNTER — Inpatient Hospital Stay: Payer: Self-pay | Admitting: Psychiatry

## 2013-01-22 ENCOUNTER — Inpatient Hospital Stay: Payer: Self-pay | Admitting: Psychiatry

## 2013-01-22 LAB — ETHANOL
Ethanol %: <0.003 % (ref 0.000–0.080)
Ethanol: <3 mg/dL

## 2013-01-22 LAB — URINALYSIS, COMPLETE
Bacteria: NONE SEEN
Bilirubin,UR: NEGATIVE
Nitrite: NEGATIVE
Ph: 8 (ref 4.5–8.0)
Protein: NEGATIVE
RBC,UR: <1 /HPF (ref 0–5)

## 2013-01-22 LAB — CBC
HCT: 34.5 % — ABNORMAL LOW (ref 40.0–52.0)
MCV: 99 fL (ref 80–100)
WBC: 8.3 10*3/uL (ref 3.8–10.6)

## 2013-01-22 LAB — COMPREHENSIVE METABOLIC PANEL
Alkaline Phosphatase: 99 U/L (ref 50–136)
Chloride: 97 mmol/L — ABNORMAL LOW (ref 98–107)
Co2: 25 mmol/L (ref 21–32)
Creatinine: 0.92 mg/dL (ref 0.60–1.30)
EGFR (African American): >60
Osmolality: 258 (ref 275–301)
Potassium: 3.5 mmol/L (ref 3.5–5.1)
SGOT(AST): 16 U/L (ref 15–37)
SGPT (ALT): 15 U/L (ref 12–78)
Sodium: 130 mmol/L — ABNORMAL LOW (ref 136–145)
Total Protein: 7.8 g/dL (ref 6.4–8.2)

## 2013-01-22 LAB — SALICYLATE LEVEL: Salicylates, Serum: 6.6 mg/dL — ABNORMAL HIGH

## 2013-01-22 LAB — DRUG SCREEN, URINE
Cocaine Metabolite,Ur ~~LOC~~: NEGATIVE (ref ?–300)
MDMA (Ecstasy)Ur Screen: NEGATIVE (ref ?–500)
Methadone, Ur Screen: NEGATIVE (ref ?–300)
Opiate, Ur Screen: NEGATIVE (ref ?–300)
Phencyclidine (PCP) Ur S: NEGATIVE (ref ?–25)

## 2013-01-23 LAB — BASIC METABOLIC PANEL
BUN: 4 mg/dL — ABNORMAL LOW (ref 7–18)
Calcium, Total: 10.4 mg/dL — ABNORMAL HIGH (ref 8.5–10.1)
Co2: 26 mmol/L (ref 21–32)
EGFR (African American): >60
Glucose: 77 mg/dL (ref 65–99)
Potassium: 4.5 mmol/L (ref 3.5–5.1)
Sodium: 137 mmol/L (ref 136–145)

## 2013-07-16 ENCOUNTER — Emergency Department: Payer: Self-pay | Admitting: Emergency Medicine

## 2013-07-16 LAB — ETHANOL: Ethanol %: <0.003 % (ref 0.000–0.080)

## 2013-07-16 LAB — CBC
HCT: 31.8 % — ABNORMAL LOW (ref 40.0–52.0)
HGB: 10.5 g/dL — AB (ref 13.0–18.0)
MCH: 34 pg (ref 26.0–34.0)
MCHC: 33.1 g/dL (ref 32.0–36.0)
MCV: 103 fL — AB (ref 80–100)
Platelet: 458 10*3/uL — ABNORMAL HIGH (ref 150–440)
RBC: 3.1 10*6/uL — ABNORMAL LOW (ref 4.40–5.90)
RDW: 14.5 % (ref 11.5–14.5)
WBC: 7.1 10*3/uL (ref 3.8–10.6)

## 2013-07-16 LAB — COMPREHENSIVE METABOLIC PANEL
ANION GAP: 7 (ref 7–16)
Albumin: 3.1 g/dL — ABNORMAL LOW (ref 3.4–5.0)
Alkaline Phosphatase: 82 U/L
BUN: 9 mg/dL (ref 7–18)
Bilirubin,Total: 0.2 mg/dL (ref 0.2–1.0)
CHLORIDE: 103 mmol/L (ref 98–107)
CREATININE: 1.09 mg/dL (ref 0.60–1.30)
Calcium, Total: 10.5 mg/dL — ABNORMAL HIGH (ref 8.5–10.1)
Co2: 24 mmol/L (ref 21–32)
EGFR (Non-African Amer.): >60
Glucose: 92 mg/dL (ref 65–99)
OSMOLALITY: 267 (ref 275–301)
Potassium: 3.5 mmol/L (ref 3.5–5.1)
SGOT(AST): 20 U/L (ref 15–37)
SGPT (ALT): 9 U/L — ABNORMAL LOW (ref 12–78)
Sodium: 134 mmol/L — ABNORMAL LOW (ref 136–145)
TOTAL PROTEIN: 8.1 g/dL (ref 6.4–8.2)

## 2013-07-16 LAB — URINALYSIS, COMPLETE
BILIRUBIN, UR: NEGATIVE
BLOOD: NEGATIVE
Bacteria: NONE SEEN
Glucose,UR: NEGATIVE mg/dL (ref 0–75)
Ketone: NEGATIVE
Leukocyte Esterase: NEGATIVE
NITRITE: NEGATIVE
Ph: 7 (ref 4.5–8.0)
Protein: NEGATIVE
Specific Gravity: 1.005 (ref 1.003–1.030)
Squamous Epithelial: NONE SEEN
WBC UR: 1 /HPF (ref 0–5)

## 2013-07-16 LAB — DRUG SCREEN, URINE

## 2013-07-16 LAB — SALICYLATE LEVEL: Salicylates, Serum: 4.7 mg/dL — ABNORMAL HIGH

## 2013-07-16 LAB — ACETAMINOPHEN LEVEL: Acetaminophen: <2 ug/mL

## 2013-08-25 LAB — DRUG SCREEN, URINE
Amphetamines, Ur Screen: NEGATIVE (ref ?–1000)
BENZODIAZEPINE, UR SCRN: NEGATIVE (ref ?–200)
Barbiturates, Ur Screen: NEGATIVE (ref ?–200)
Cannabinoid 50 Ng, Ur ~~LOC~~: NEGATIVE (ref ?–50)
Cocaine Metabolite,Ur ~~LOC~~: NEGATIVE (ref ?–300)
MDMA (ECSTASY) UR SCREEN: NEGATIVE (ref ?–500)
Methadone, Ur Screen: NEGATIVE (ref ?–300)
OPIATE, UR SCREEN: NEGATIVE (ref ?–300)
Phencyclidine (PCP) Ur S: NEGATIVE (ref ?–25)
Tricyclic, Ur Screen: NEGATIVE (ref ?–1000)

## 2013-08-25 LAB — COMPREHENSIVE METABOLIC PANEL
ALK PHOS: 72 U/L
Albumin: 3.2 g/dL — ABNORMAL LOW (ref 3.4–5.0)
Anion Gap: 6 — ABNORMAL LOW (ref 7–16)
BILIRUBIN TOTAL: 0.4 mg/dL (ref 0.2–1.0)
BUN: 7 mg/dL (ref 7–18)
CHLORIDE: 103 mmol/L (ref 98–107)
CO2: 23 mmol/L (ref 21–32)
Calcium, Total: 10.6 mg/dL — ABNORMAL HIGH (ref 8.5–10.1)
Creatinine: 1.47 mg/dL — ABNORMAL HIGH (ref 0.60–1.30)
GFR CALC NON AF AMER: 55 — AB
Glucose: 119 mg/dL — ABNORMAL HIGH (ref 65–99)
Osmolality: 264 (ref 275–301)
POTASSIUM: 3.8 mmol/L (ref 3.5–5.1)
SGOT(AST): 24 U/L (ref 15–37)
SGPT (ALT): 8 U/L — ABNORMAL LOW (ref 12–78)
Sodium: 132 mmol/L — ABNORMAL LOW (ref 136–145)
Total Protein: 8.2 g/dL (ref 6.4–8.2)

## 2013-08-25 LAB — CBC
HCT: 35.5 % — ABNORMAL LOW (ref 40.0–52.0)
HGB: 11.7 g/dL — ABNORMAL LOW (ref 13.0–18.0)
MCH: 33.9 pg (ref 26.0–34.0)
MCHC: 32.9 g/dL (ref 32.0–36.0)
MCV: 103 fL — AB (ref 80–100)
PLATELETS: 286 10*3/uL (ref 150–440)
RBC: 3.45 10*6/uL — ABNORMAL LOW (ref 4.40–5.90)
RDW: 14.2 % (ref 11.5–14.5)
WBC: 4.7 10*3/uL (ref 3.8–10.6)

## 2013-08-25 LAB — ETHANOL
Ethanol %: <0.003 % (ref 0.000–0.080)
Ethanol: <3 mg/dL

## 2013-08-25 LAB — VALPROIC ACID LEVEL: Valproic Acid: 50 ug/mL

## 2013-08-25 LAB — ACETAMINOPHEN LEVEL

## 2013-08-25 LAB — SALICYLATE LEVEL: Salicylates, Serum: 7.9 mg/dL — ABNORMAL HIGH

## 2013-08-25 LAB — TSH: Thyroid Stimulating Horm: 4.13 u[IU]/mL

## 2013-08-27 ENCOUNTER — Inpatient Hospital Stay: Payer: Self-pay | Admitting: Psychiatry

## 2013-08-28 LAB — BASIC METABOLIC PANEL
Anion Gap: 5 — ABNORMAL LOW (ref 7–16)
BUN: 11 mg/dL (ref 7–18)
Calcium, Total: 10.1 mg/dL (ref 8.5–10.1)
Chloride: 104 mmol/L (ref 98–107)
Co2: 29 mmol/L (ref 21–32)
Creatinine: 1.02 mg/dL (ref 0.60–1.30)
EGFR (African American): >60
EGFR (Non-African Amer.): >60
Glucose: 67 mg/dL (ref 65–99)
Osmolality: 273 (ref 275–301)
POTASSIUM: 4.6 mmol/L (ref 3.5–5.1)
Sodium: 138 mmol/L (ref 136–145)

## 2013-08-30 LAB — LIPID PANEL
CHOLESTEROL: 195 mg/dL (ref 0–200)
HDL Cholesterol: 57 mg/dL (ref 40–60)
Ldl Cholesterol, Calc: 119 mg/dL — ABNORMAL HIGH (ref 0–100)
TRIGLYCERIDES: 94 mg/dL (ref 0–200)
VLDL Cholesterol, Calc: 19 mg/dL (ref 5–40)

## 2014-01-27 LAB — COMPREHENSIVE METABOLIC PANEL
ALBUMIN: 2.7 g/dL — AB (ref 3.4–5.0)
ALK PHOS: 89 U/L
ALT: 11 U/L — AB
Anion Gap: 5 — ABNORMAL LOW (ref 7–16)
BUN: 5 mg/dL — AB (ref 7–18)
Bilirubin,Total: 0.4 mg/dL (ref 0.2–1.0)
CALCIUM: 9.5 mg/dL (ref 8.5–10.1)
CO2: 28 mmol/L (ref 21–32)
Chloride: 94 mmol/L — ABNORMAL LOW (ref 98–107)
Creatinine: 0.97 mg/dL (ref 0.60–1.30)
GLUCOSE: 89 mg/dL (ref 65–99)
OSMOLALITY: 252 (ref 275–301)
Potassium: 3.6 mmol/L (ref 3.5–5.1)
SGOT(AST): 10 U/L — ABNORMAL LOW (ref 15–37)
SODIUM: 127 mmol/L — AB (ref 136–145)
Total Protein: 7.3 g/dL (ref 6.4–8.2)

## 2014-01-27 LAB — CBC
HCT: 29.7 % — ABNORMAL LOW (ref 40.0–52.0)
HGB: 9.9 g/dL — ABNORMAL LOW (ref 13.0–18.0)
MCH: 33.4 pg (ref 26.0–34.0)
MCHC: 33.4 g/dL (ref 32.0–36.0)
MCV: 100 fL (ref 80–100)
PLATELETS: 363 10*3/uL (ref 150–440)
RBC: 2.97 10*6/uL — AB (ref 4.40–5.90)
RDW: 13.4 % (ref 11.5–14.5)
WBC: 6.5 10*3/uL (ref 3.8–10.6)

## 2014-01-27 LAB — DRUG SCREEN, URINE

## 2014-01-27 LAB — URINALYSIS, COMPLETE
BLOOD: NEGATIVE
Bacteria: NONE SEEN
Bilirubin,UR: NEGATIVE
Glucose,UR: NEGATIVE mg/dL (ref 0–75)
Ketone: NEGATIVE
LEUKOCYTE ESTERASE: NEGATIVE
NITRITE: NEGATIVE
Ph: 7 (ref 4.5–8.0)
Protein: NEGATIVE
RBC, UR: NONE SEEN /HPF (ref 0–5)
SQUAMOUS EPITHELIAL: NONE SEEN
Specific Gravity: 1.004 (ref 1.003–1.030)
WBC UR: NONE SEEN /HPF (ref 0–5)

## 2014-01-27 LAB — SALICYLATE LEVEL: Salicylates, Serum: 4.6 mg/dL — ABNORMAL HIGH

## 2014-01-27 LAB — ACETAMINOPHEN LEVEL: Acetaminophen: <2 ug/mL

## 2014-01-27 LAB — ETHANOL: Ethanol: <3 mg/dL

## 2014-01-28 ENCOUNTER — Inpatient Hospital Stay: Payer: Self-pay | Admitting: Psychiatry

## 2014-01-29 LAB — VALPROIC ACID LEVEL: Valproic Acid: 70 ug/mL

## 2014-01-30 LAB — OSMOLALITY, URINE: Osmolality: 615 mOsm/kg

## 2014-01-30 LAB — SODIUM: Sodium: 139 mmol/L (ref 136–145)
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
## 2014-07-05 NOTE — Consult Note (Signed)
Brief Consult Note: Diagnosis: Undifferentiated schizophrenia.   Patient was seen by consultant.   Consult note dictated.   Recommend further assessment or treatment.   Orders entered.   Discussed with Attending MD.   Comments: Jeff Long was brought to ER floridfly psychotic in the context of medication noncompliance. He acceptes medications here. he is cool and collected. He agrees to a Haldol decanoate shot.   PLAN: 1. Haldol decanoate 100 mg im every 4 weeks. First dose now.  2. Will admit to BMU..  Electronic Signatures: Kristine Linea (MD)  (Signed [phone] 15:34)  Authored: Brief Consult Note   Last Updated: 22-Nov-13 15:34 by Kristine Linea (MD)

## 2014-07-05 NOTE — Consult Note (Signed)
Brief Consult Note: Diagnosis: schizophrenia.   Patient was seen by consultant.   Consult note dictated.   Recommend further assessment or treatment.   Orders entered.   Discussed with Attending MD.   Comments: Psychiatry: Patient seen and chart reviewed. Patient very disorganized but denies SI or HI. He very much does not want to come into the hospital. He agrees to get his haldol-d shot today. Will order haldol-dec  once and let him sit overnight and reevaluate in the AM possiblly to go back to group home if behavior safe.  Electronic Signatures: Ondrea Dow, Jackquline Denmark (MD)  (Signed 20-Dec-13 17:20)  Authored: Brief Consult Note   Last Updated: 20-Dec-13 17:20 by Audery Amel (MD)

## 2014-07-05 NOTE — Consult Note (Signed)
Details:    - Psychiatry: Patient seen. He received his haldol-dec shot last night willingly and without trouble. Slept well. This am he is awake and alert and able to have a lucid and calm conversation. Denies any suicidal ideation and denies any thought of self harm or of aggression to others. Not behaving dangerously here. Group home agree to allow him to return home today ghiven his overnight improvement. Counceling done with pt about staying on meds. He agrees. Will dc the commitment prtition as he no longer meets criteria. ER doctor agrees. Patient will be released back to [facility] and to his follow up with Dr Elesa Massed and the psychiatric team as usual.   Electronic Signatures: Clapacs, Jackquline Denmark (MD)  (Signed 21-Dec-13 09:43)  Authored: Details   Last Updated: 21-Dec-13 09:43 by Audery Amel (MD)

## 2014-07-05 NOTE — Consult Note (Signed)
PATIENT NAME:  Jeff Long, Jeff Long MR#:  [id number] DATE OF BIRTH:  04-27-1960  DATE OF CONSULTATION:  03/06/2012  CONSULTING PHYSICIAN:  Audery Amel, MD  IDENTIFYING INFORMATION AND REASON FOR CONSULT: The patient is a 54 year old man with schizophrenia who was brought to the Emergency Room by his Group Home because of concerns that he has been increasingly disorganized and agitated. Consult is for evaluation of appropriate psychiatric management.   HISTORY OF PRESENT ILLNESS: Information is obtained from the patient and the chart. The Group Home reports that for the past week or two he has been getting increasingly agitated. He talks more rapidly and in more of a disorganized fashion. He has been talking about bizarre things like people chopping his penis off. He has been poking his head into other residents' rooms, and they have been concerned that he may be invading their space. He has not apparently been actually assaultive or directly threatening to anyone. He has been compliant as far as is known with his oral medication, and he got his last Haldol Decanoate shot the last week of November. As far as is known, he has not been abusing substances. He denies it. We cannot get a drug screen at this point. He is followed at [facility], and they have watching him; and he has been compliant, but they have been concerned also that he is increasingly disorganized. The patient tells me that he does not see that there is any problem with him. He has no insight into the fact that his mental state has changed. He insists that there is nothing dangerous about him at all. His speech is presented in a very rapid, disorganized manner with multiple bizarre references.   PAST PSYCHIATRIC HISTORY: The patient is relatively new to our system within the last year but has a long history of psychosis and apparently has had multiple, probably 100 or more, hospitalizations lifetime. He is currently staying at [facility]. He was last at our hospital in November of this year. He is currently being treated with Haldol Decanoate injections 150 mg every 3 to 4 weeks, Zyprexa 30 mg at night, Haldol 20 mg at night, ferrous sulfate 325 mg a day, Depakote 500 mg 3 times a day, Cogentin 1 mg twice a day. The patient is not very forthcoming with history. He does not have a history of being violent or aggressive here. It is not known how much aggression he has had in the past.   SUBSTANCE ABUSE HISTORY: The patient will not give any direct information about that right now.   SOCIAL HISTORY: The patient is a disabled adult resident of a group home. He has a sister who is his legal guardian.  PAST MEDICAL HISTORY: He has vitamin D deficiency and a history of anemia, otherwise appears to be in reasonable physical health.   CURRENT MEDICATIONS: Haldol Decanoate 150 mg IM q. 3 to 4 weeks with the last injection on November 26th, Zyprexa 30 mg at night, Haldol 20 mg at night, ferrous sulfate 325 mg a day, Depakote 500 mg 3 times a day, vitamin B12 1000 units a day, Cogentin 1 mg twice a day.   ALLERGIES: FLUPHENAZINE.   REVIEW OF SYSTEMS: The patient is not able to give much information. He denies suicidal or homicidal ideation. He says that he has been seeing things, but he will not stop to give me much detail. He denies auditory hallucinations currently. He says that he is sleeping fine and  says he has no medical problems.   MENTAL STATUS EXAM: Appropriately dressed man, somewhat disheveled, interviewed in the Emergency Room. Eye contact is good. Psychomotor activity is normal. Speech is rapid, pressured, but flat in tone. Affect is flat, somewhat odd. Mood is stated as fine. Thoughts show flight of ideas and are disorganized with multiple bizarre references and repetitive stereotyped speech. Denies acute auditory hallucinations. Denies suicidal or homicidal ideation and denies any thought about hurting himself or anyone else.  Judgment and insight chronically poor. Baseline intelligence probably normal but impaired by psychosis. Cannot really evaluate short or long-term memory except that he does remember having been here before.   ASSESSMENT: This is a 54 year old man with chronic schizophrenia who has been decompensating recently for unknown reason. He possibly could have been cheeking his medicine. It is hard to prove since he will not allow any blood tests right now. He is not behaving in a specifically dangerous manner, but his disorganized decompensation raises worries that he might become erratic in his behavior. The patient adamantly does not want to be admitted to the hospital.   TREATMENT PLAN: I do not want to get into a fight with the patient or unduly agitate him for no reason. We have come up with a potential plan at this point where he agrees to take his Haldol Decanoate injection and continue his normal medicine tonight, and we will re-discuss it with his Group Home tomorrow. If he is behaving reasonably well, they will probably agree to take him back to the group home since he has good follow-up treatment in place. Otherwise, we will need him to stay in the behavioral health unit to continue treatment and re-evaluate for a new plan.    DIAGNOSIS, PRINCIPAL AND PRIMARY:  AXIS I: Schizophrenia, undifferentiated.   SECONDARY DIAGNOSES: AXIS I: No further.   AXIS II: No diagnosis.   AXIS III: Vitamin B12 deficiency, vitamin D deficiency, chronic anemia.   AXIS IV: Severe from chronic burden of illness.   AXIS V: Functioning at time of evaluation 37.  ____________________________ Audery Amel, MD jtc:cb D: 03/06/2012 17:30:25 ET T: 03/07/2012 13:57:21 ET JOB#: [id number]  cc: Audery Amel, MD, <Dictator> Audery Amel MD ELECTRONICALLY SIGNED 03/08/2012 13:50

## 2014-07-08 ENCOUNTER — Emergency Department
Admit: 2014-07-08 | Disposition: A | Discharge status: Home / Self Care | Payer: Self-pay | Admitting: Emergency Medicine

## 2014-07-08 NOTE — H&P (Signed)
PATIENT NAME:  Jeff Long, Jeff Long MR#:  [id number] DATE OF BIRTH:  [birth year]  DATE OF ADMISSION:  01/22/2013  IDENTIFYING INFORMATION AND CHIEF COMPLAINT:  This is a 54 year old man with a history of schizophrenia who was brought into the Emergency Room under involuntary commitment papers that were taken out at the Outpatient Clinic stating that he had been hypersexual and disruptive and aggressive.   CHIEF COMPLAINT:  "I didn't do anything".   HISTORY OF PRESENT ILLNESS:  Information obtained from the patient and the chart. This patient was sent here from his outpatient appointment at [facility]. They reported that he had been getting more aggressive and agitated and had been making hypersexual comments to staff that were disturbing. He, himself, denies it. He blames it all on the [facility], says that he did not harm anyone and was not being threatening. He says that he has not had any problems at all at his group home. Secondhand information from the group home is that they feel he has been decompensating over the last couple of weeks, getting increasingly agitated, hypersexual, making comments that are disruptive at home as well. There is no indication that he has been noncompliant with his medicine. No indication that he has been abusing drugs. The patient, himself, has no insight about this.   PAST PSYCHIATRIC HISTORY:  A long history of schizophrenia, multiple hospitalizations going back at least to the 1980s including extended hospitalizations. He has been followed in the past by ACT teams but he is currently being seen regularly at [facility]. He is on a long-acting injectable medicine. No history of suicide attempts noted. He does have a history of being loud and aggressive at times. He is currently being treated with Haldol Decanoate as well as Zyprexa and a modest dose of Depakote.   PAST MEDICAL HISTORY:  No significant ongoing medical problems. No history of diabetes. No history of high blood  pressure, heart disease, thyroid disease, etc.   SOCIAL HISTORY:  The patient is incompetent and has a guardian. He is currently living at a group home in [place]. He has had lot of different group homes he has been placed in because of outbursts and noncompliance with treatment. Minimal family or social involvement.   SUBSTANCE ABUSE HISTORY:  He smokes a lot of cigarettes, denies that he has been drinking or using any drugs. I do not see that there is a clear past history of substance abuse.   REVIEW OF SYSTEMS:  Denies that he has been suicidal or aggressive. Not indicating any hallucinations. No physical symptoms reported right now.   CURRENT MEDICATIONS:  Propranolol 10 mg b.i.d., Depakote 1000 mg at night, Haldol 10 mg at night, haloperidol Decanoate 100 mg intramuscular every 4 weeks, the next due a week from today, Zyprexa 20 mg a day.   ALLERGIES:  FLUPHENAZINE.   FAMILY HISTORY:  None reported.   MENTAL STATUS EXAMINATION:  A somewhat disheveled gentleman, looks his stated age, slightly cooperative with the interview although he gets loud and aggressive at times. Eye contact good. Psychomotor activity fidgety when he is talking with me, which then goes back being completely withdrawn under his covers when the conversation ends. Speech is loud, constricted in tone, rapid. Mood is stated as being angry. Affect is irritated. Some difficulty controlling his loudness. At the same time, he did make any threats towards me. He did not act in an aggressive or violent manner. Denies suicidal ideation. Not reporting current hallucinations. Judgment and  insight impaired. Intelligence probably average, but impaired by mental illness. Alert and oriented to situation.   PHYSICAL EXAMINATION: GENERAL:  A somewhat disheveled gentleman, who does not appear to be in any acute physical distress.  SKIN:  No skin lesions identified.  HEENT:  Pupils equal and reactive. Face symmetric.   MUSCULOSKELETAL:  Full range of motion at all extremities. Normal gait. Full strength and reflexes normal throughout. Cranial nerves symmetric and normal.  LUNGS:  Clear without wheezes.  HEART:  Regular rate and rhythm.  ABDOMEN:  Soft, nontender, normal bowel sounds. VITAL SIGNS:  Temperature 98.4, pulse 104, respirations 20, blood pressure 111/65.   LABORATORY RESULTS:  Chemistry panel shows a low sodium at 130, BUN low at 6, chloride low 97, albumin low at 3.3. TSH normal at 0.7. CBC shows an anemia with a hemoglobin of 12.1. Salicylates elevated  6.6 and acetaminophen not detected.   ASSESSMENT:  A 54 year old man with schizophrenia, who is decompensating, more agitated. Some disturbing statements, disruptive at his group home and his outpatient provider. Probably needs hospitalization for safety to preserve his treatment report.   TREATMENT PLAN:  Admit to the hospital. I tried to explain it as best I could to the patient. He is still a little bit agitated but not directly threatening. I think we should go ahead and give him his Haldol Decanoate shot now even though it is a week early. I would also like to increase his Depakote to 1000 mg twice a day. P.r.n. Cogentin and Ativan will be provided. We will try and engage him in groups on the unit. Try and work with him on improving his insight, see if we get him stabilized a little better before returning to his group home.   DIAGNOSIS, PRINCIPAL AND PRIMARY:  AXIS I: Schizophrenia, undifferentiated.   SECONDARY DIAGNOSES:  AXIS I: No further.  AXIS II: No diagnosis.  AXIS III: No diagnosis.  AXIS IV: Moderate to severe from chronic disability.  AXIS V: Functioning at time of evaluation 54.   ____________________________ Audery Amel, MD jtc:jm D: 01/22/2013 17:53:20 ET T: 01/22/2013 18:19:16 ET JOB#: [id number]  cc: Audery Amel, MD, <Dictator> Audery Amel MD ELECTRONICALLY SIGNED 01/22/2013 21:05

## 2014-07-08 NOTE — Discharge Summary (Signed)
PATIENT NAME:  ARIHAAN, PYATT MR#:  [id number] DATE OF BIRTH:  December 15, 1960  DATE OF ADMISSION:  08/08/2012 DATE OF DISCHARGE:  08/08/2012  REASON FOR ADMISSION: Hyponatremia.  CHIEF COMPLAINT: Sent from group home due to agitation and acute psychosis.   DISCHARGE DIAGNOSES: 1.  Hyponatremia, related to high water intake.  2.  History of schizophrenia.  3.  Anemia of iron deficiency.  4.  Vitamin D deficiency.   HOSPITAL COURSE: Mr. Schlotter is a very nice 54 year old gentleman who has a history of schizophrenia. He was recently moved to a group home in [place], and overall he was sent to the ER based on behavior changes where he was having some acute psychosis and agitation.   He was found to be severely hyponatremic, with a sodium of 119. His previous labs had shown that his sodium was 136 couple of weeks prior.   He has decreased osmolality, with increased urine output. The patient was severely agitated, for which he required multiple doses of Ativan and Haldol so he could be controlled and be put in the hospital.   Apparently, the patient was drinking up to 12 large glasses of water on a daily basis as he felt very thirsty. The patient was evaluated by renal. We put him on a water restriction, and the patient received saline solution. His sodium levels started to correct very quickly, and since this was acute-onset due to potomania renal was agreeing with the rate of the correction.   So overall, after the patient was limited to intake of fluid of 1000 mL a day his sodium corrected.   The patient was evaluated also by psychiatry to see if he needed to stay in the behavioral health unit, and we were clearing him  to go, but after the evaluation by Dr. Guss Bunde she recommended that the patient to go back to the group home and continue his home medications.   CONDITION ON DISCHARGE: The patient was discharged in good condition with the following medications: Ferrous sulfate 325 mg once a day,  vitamin D 3, 1000 mg once a day, Depakote 1000 mg once a day, haloperidol 5 mg injectable solution, Depot once a month, haloperidol 20 mg once a day, benztropine 1 mg twice daily.   Follow up with the primary care physician in the group home, with med staff services.   The patient is to be seen within the next week.   I spent about 35 minutes with this discharge after consultations and placing the patient.    ____________________________ Felipa Furnace, MD rsg:dm D: 08/09/2012 06:49:44 ET T: 08/09/2012 11:39:42 ET JOB#: [id number]  cc: Felipa Furnace, MD, <Dictator> Karl Erway Juanda Chance MD ELECTRONICALLY SIGNED 08/13/2012 21:29

## 2014-07-08 NOTE — Consult Note (Signed)
Brief Consult Note: Diagnosis: Schizophrenia, PT.   Patient was seen by consultant.   Recommend further assessment or treatment.   Orders entered.   Comments: Pt seen in ED [facility]. He reported that he continues to have AH and he was having conflict with the staff at the group home and he does not want to go back to the same place. He stated that he is taking the meds but they are not helpful. No side effects noted.   Plan; Case discussed with Dr Jennet Maduro and he will be given [facility] Dec 100mg  IM x 1 dose today.  Will be admitted to [facility] for stabilization and safety and adjustment of his medications.  Electronic Signatures: Rhunette Croft (MD)  (Signed 19-Aug-14 10:35)  Authored: Brief Consult Note   Last Updated: 19-Aug-14 10:35 by Rhunette Croft (MD)

## 2014-07-08 NOTE — H&P (Signed)
PATIENT NAME:  Jeff Long, Jeff Long MR#:  [id number] DATE OF BIRTH:  May 06, 1960  DATE OF ADMISSION:  08/07/2012  REFERRING PHYSICIAN:  Dr. Dorothea Glassman.  CHIEF COMPLAINT:  Hyponatremia.   HISTORY OF PRESENT ILLNESS:  This is a 54 year old male with significant past medical history of schizophrenia, recently moved to a group home in [place], the patient was sent from group home for agitation and acute psychosis, the patient had multiple presentation in the past to ED with similar presentation where he was evaluated by psychiatry, the patient was found to have hyponatremia with a sodium of 119, the patient's last known sodium level was 136 on 07/27/2012, but at one point it was as low as 127, the patient's osmolality was at 237, initially upon presentation the patient was extremely agitated, and required chemical sedation where he received IV Ativan and IM Haldol, the patient was interviewed and seen by hospitalist service a few hours after presentation to ED after he calmed down and woke up, currently the patient is quiet and compliant, the patient reports he has been drinking up to 12 large glasses of water on a daily basis, the patient received 2 liters of IV normal saline and awaiting repeat sodium level, hospitalist service were requested to admit the patient for his hyponatremia.  The patient denies any chest pain, any shortness of breath, any swelling, any palpitation, any loss of consciousness, any nausea, any vomiting or abdominal pain.  The patient was involuntarily committed in ED.   PAST MEDICAL HISTORY:  Schizophrenia.   PAST SURGICAL HISTORY:  None.   SOCIAL HISTORY:  The patient is disabled, lives in a group home, the patient reports smoking 1-1/2 packs per day.  Denies any alcohol or illicit drug use.   FAMILY HISTORY:  Significant for diabetes mellitus in his mother as per the patient.   ALLERGIES:  FLUPHENAZINE.   HOME MEDICATIONS: 1.  Depakote 1 gram at bedtime.  2.  Benztropine 1  mg oral twice daily.  3.  Haloperidol 20 mg at bedtime.  4.  Haloperidol 5 mg/mL injectable solution once a month.  5.  Zyprexa 30 mg at bedtime.  6.  Ferrous sulfate 325 mg oral daily.  7.  Vitamin D3 1000 international units oral daily.   REVIEW OF SYSTEMS:  CONSTITUTIONAL:  The patient denies fever, chills, fatigue, weakness, weight gain, weight loss.  EYES:  Denies blurry vision, double vision, pain, inflammation, glaucoma.  EARS, NOSE, THROAT:  Denies tinnitus, ear pain, epistaxis or discharge.  RESPIRATORY:  Denies cough, wheezing, hemoptysis, dyspnea.  CARDIOVASCULAR:  Denies chest pain, edema, arrhythmia, palpitations, syncope.  GASTROINTESTINAL:  Denies nausea, vomiting, diarrhea, abdominal pain, hematemesis, melena, jaundice, rectal bleed.  GENITOURINARY:  Denies dysuria, hematuria, renal colic.  ENDOCRINE:  Denies heat or cold intolerance.  Reports polydipsia.  HEMATOLOGY:  Has history of anemia.  Denies easy bruising or bleeding diathesis.  INTEGUMENTARY:  Denies acne, rash or skin lesions.  MUSCULOSKELETAL:  Denies any swelling, gout or arthritis.  NEUROLOGIC:  Denies any headache, vertigo, tremors, ataxia, memory loss, seizures.  PSYCHIATRIC:  Has history of schizophrenia.  Denies substance abuse, alcohol abuse.   PHYSICAL EXAMINATION: VITAL SIGNS:  Temperature 98, pulse 80, respiratory rate 18, blood pressure 121/77, saturating 90% on room air.  GENERAL:  Well-nourished male who is comfortable in bed in no apparent distress.  HEENT:  Head atraumatic, normocephalic.  Pupils equal, reactive to light.  Pink conjunctivae.  Anicteric sclerae.  Moist oral mucosa.  NECK:  Supple.  No thyromegaly.  No JVD.  CHEST:  Good air entry bilaterally.  No wheezing, rales, rhonchi.  CARDIOVASCULAR:  S1, S2 heard.  No rubs, murmurs, gallops.  ABDOMEN:  Soft, nontender, nondistended.  Bowel sounds present.  EXTREMITIES:  No edema.  No clubbing.  No cyanosis.  PSYCHIATRIC:  The patient is  calm, awake, alert x 3.  At this point appears to be coherent.  Denies any suicidal or homicidal ideation.   NEUROLOGIC:  Cranial nerves grossly intact.  Motor 5 out of 5.   PERTINENT LABORATORY DATA:  Glucose 79, BUN 6, creatinine 0.77, sodium 119, potassium 4, chloride 87, CO2 25.  Valproic acid 84.  TSH 1.32.  Drugs of abuse negative.  White blood cells 6.6, hemoglobin 10.9, hematocrit 31.8, platelets 392.  Urinalysis negative for leukocyte esterase or nitrite.   ASSESSMENT AND PLAN: 1.  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  This might be contributing to his hyponatremia, especially with low osmolality, we will check urine osmolality and we will check urine sodium, as well this might be attributed to some medication as Depakote might cause him to have syndrome of inappropriate antidiuretic hormone or hyponatremia, the patient did receive a total of 2 liters of normal saline in the ED.  We will recheck his sodium level, as well we will hold his Depakote until the patient is seen by psychiatry in a.m. to re-evaluate Depakote.  As well, we will consult nephrology service.  We will have him on strict ins and outs.  We will have him on fluid restrictions 1 liter per day.  2.  Schizophrenia with acute psychosis, we will resume the patient's home medications Zyprexa, Haldol and Cogentin, but we will hold his Depakote until seen by psych.  We will consult psych.  We will have him on a sitter one-to-one.  He is currently involuntary committed, and we will have him on as needed Ativan and IM Haldol as needed for acute episodes of psychosis.  3.  Anemia.  Continue with iron supplements.  4.  Vitamin D deficiency.  Continue with vitamin D supplement.  5.  CODE STATUS:  FULL CODE.  6.  Deep vein thrombosis prophylaxis.  SubQ heparin.   Total time spent on admission and patient care 55  minutes.    ____________________________ Starleen Arms, MD dse:ea D: 08/08/2012 01:19:49 ET T: 08/08/2012 01:50:18 ET JOB#: [id number]  cc: Starleen Arms, MD, <Dictator> DAWOOD Teena Irani MD ELECTRONICALLY SIGNED 08/08/2012 4:27

## 2014-07-08 NOTE — Consult Note (Signed)
Brief Consult Note: Diagnosis: schizophrenia.   Patient was seen by consultant.   Consult note dictated.   Recommend further assessment or treatment.   Orders entered.   Discussed with Attending MD.   Comments: Mr. Tlatelpa has a h/o schizophrenia. He was brought to ER disorganized and psychotic in the context of medication noncompliance and conflict at the group home.   PLAN: 1. We restarted all medications.  2. Will encourage Haldol Decanoate injection.  3. He needs placement.  4. I will follo along.  Electronic Signatures: Kristine Linea (MD)  (Signed 18-Aug-14 21:21)  Authored: Brief Consult Note   Last Updated: 18-Aug-14 21:21 by Kristine Linea (MD)
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
## 2014-07-09 LAB — CBC
HCT: 30.1 % — ABNORMAL LOW (ref 40.0–52.0)
HGB: 10 g/dL — AB (ref 13.0–18.0)
MCH: 32.7 pg (ref 26.0–34.0)
MCHC: 33.1 g/dL (ref 32.0–36.0)
MCV: 99 fL (ref 80–100)
Platelet: 281 10*3/uL (ref 150–440)
RBC: 3.05 10*6/uL — AB (ref 4.40–5.90)
RDW: 13.2 % (ref 11.5–14.5)
WBC: 4.4 10*3/uL (ref 3.8–10.6)

## 2014-07-09 LAB — URINALYSIS, COMPLETE
Bacteria: NONE SEEN
Bilirubin,UR: NEGATIVE
Blood: NEGATIVE
Glucose,UR: NEGATIVE mg/dL (ref 0–75)
Ketone: NEGATIVE
Leukocyte Esterase: NEGATIVE
NITRITE: NEGATIVE
Ph: 7 (ref 4.5–8.0)
Protein: NEGATIVE
RBC,UR: NONE SEEN /HPF (ref 0–5)
SPECIFIC GRAVITY: 1.005 (ref 1.003–1.030)
Squamous Epithelial: NONE SEEN

## 2014-07-09 LAB — DRUG SCREEN, URINE
AMPHETAMINES, UR SCREEN: NEGATIVE
BARBITURATES, UR SCREEN: NEGATIVE
Benzodiazepine, Ur Scrn: NEGATIVE
COCAINE METABOLITE, UR ~~LOC~~: NEGATIVE
Cannabinoid 50 Ng, Ur ~~LOC~~: NEGATIVE
MDMA (Ecstasy)Ur Screen: NEGATIVE
Methadone, Ur Screen: NEGATIVE
Opiate, Ur Screen: NEGATIVE
Phencyclidine (PCP) Ur S: NEGATIVE
TRICYCLIC, UR SCREEN: NEGATIVE

## 2014-07-09 LAB — COMPREHENSIVE METABOLIC PANEL
ALBUMIN: 2.9 g/dL — AB
ALK PHOS: 70 U/L
ANION GAP: 5 — AB (ref 7–16)
BUN: 5 mg/dL — ABNORMAL LOW
Bilirubin,Total: 0.4 mg/dL
CALCIUM: 9.7 mg/dL
Chloride: 100 mmol/L — ABNORMAL LOW
Co2: 25 mmol/L
Creatinine: 0.75 mg/dL
EGFR (Non-African Amer.): >60
Glucose: 84 mg/dL
Potassium: 4 mmol/L
SGOT(AST): 11 U/L — ABNORMAL LOW
SGPT (ALT): 8 U/L — ABNORMAL LOW
Sodium: 130 mmol/L — ABNORMAL LOW
TOTAL PROTEIN: 6.9 g/dL

## 2014-07-09 LAB — ETHANOL: Ethanol: <5 mg/dL

## 2014-07-09 LAB — ACETAMINOPHEN LEVEL

## 2014-07-09 LAB — SALICYLATE LEVEL: Salicylates, Serum: <4 mg/dL
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
## 2014-07-09 NOTE — Consult Note (Signed)
Brief Consult Note: Diagnosis: schizophrenia.   Patient was seen by consultant.   Consult note dictated.   Recommend further assessment or treatment.   Orders entered.   Discussed with Attending MD.   Comments: Psychiatry: PAtient increasingly paranoid and hostile at group home. Initiate petition and admit to inpt [facility].  Electronic Signatures: Audery Amel (MD)  (Signed [phone] 13:42)  Authored: Brief Consult Note   Last Updated: 13-Nov-15 13:42 by Audery Amel (MD)
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
## 2014-07-09 NOTE — Consult Note (Signed)
PATIENT NAME:  Jeff Long, Jeff Long MR#:  [id number] DATE OF BIRTH:  05/10/1960  DATE OF CONSULTATION:  01/28/2014  REFERRING PHYSICIAN:  Dr. Toni Amend  CONSULTING PHYSICIAN:  Ena Dawley. Clent Ridges, MD  REASON FOR CONSULTATION: Abnormal laboratory values.   HISTORY OF PRESENT ILLNESS: This 54 year old man with schizophrenia presents to the Emergency Room from a group home for aggressive behavior and hallucinations. He does not have any physical complaints at this time. He is noted to be hyponatremic and anemic and hospitalist services are asked for consultation.   PAST MEDICAL HISTORY: 1.  Schizophrenia.  2.  Possible Crohn disease (patient denies, but this is noted in his chart).   FAMILY HISTORY: Father had colon cancer. No heart disease or stroke known. No diabetes or kidney failure.   SOCIAL HISTORY: The patient currently lives in a group home. He is a 1 pack per day smoker. He does not smoke cigarettes or use any illicit substances. He is not working. He does not use oxygen or any other home equipment for mobility.   REVIEW OF SYSTEMS: Patient refuses to answer review of systems questions. He does deny fevers, chills, change in weight, pain anywhere, shortness of breath or bleeding.   ALLERGIES: FLUPHENAZINE.   HOME MEDICATIONS: 1.  Olanzapine 20 mg 1.5 tablets orally once a day at bedtime.  2.  Haldol decanoate 100 mg per mL intramuscular solution 200 mg every 4 weeks.  3.  Ferrous sulfate 325 mg 1 tablet once a day.  4.  Divalproex sodium 500 mg delayed release tablet, 2 tablets orally twice a day with meals.  5.  Cholecalciferol 1000 units orally once a day.  6.  Benztropine 1 mg 1 tablet every 4 hours as needed for stiffness.  7.  Benztropine 1 mg tablet 4 times a day.   PHYSICAL EXAMINATION: VITAL SIGNS: Temperature 98 degrees, pulse 94, respirations 20, blood pressure 106/55, oxygenation 97% on room air.  GENERAL: The patient is thin, in no acute distress.  The patient declines  physical examination, states that he is very angry to be in the hospital and does not wish to be examined.   LABORATORY DATA: Sodium 127, potassium 3.6, chloride 94, bicarbonate 28, BUN 5, creatinine 0.97. Ethanol level is negative, total protein 7.3, albumin 2.7, bilirubin 0.4, alkaline phosphatase 89, AST 10, ALT 11. Urine drug screen is negative. White blood cells 6.5, hemoglobin 9.9, MCV 100, platelets 363,000. UA negative for signs of infection. Serum acetaminophen less than 2.0. Serum salicylates 4.6.   ASSESSMENT AND PLAN: 1.  Hyponatremia: This is not a new condition for this patient. He has been seen many times in our Emergency Room and has had serum sodium as low as 119 previously. 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 He reports that he drinks a lot of liquids and does not eat much. Would advise volume restriction and improved nutrition. I would also check a TSH, urine osmolality, urine sodium, and urine creatinine. Also, looking at his medications, hyponatremia is a rare side effect of Depakote, olanzapine, and Haldol. These medications may be contributing to his hyponatremia. If they are working, however, to control his mood, I would certainly favor attempting fluid restriction and improved nutrition before changing his mood altering medications. I do not see that a TSH has been checked in this hospital before.  2.  Anemia: His hemoglobin has gone from 11 to 9.9 over the past 6 months. Hemoglobin has usually stayed around 10  to 11 in the past. He does have possible history of Crohn disease, though he denies this. He states that he has had at least 3 colonoscopies. He states that he has not noted any dark, tarry stools or blood in his stool. I would guaiac his stool. Would check iron studies. Would look into his last colonoscopy and refer him to gastroenterology for further evaluation if this anemia is persistent. He does have a fairly high MCV  which may be a medication effect. Would also check a B12 level to ensure that he is not B12 deficient.  3.  Schizophrenia: The patient is being admitted to behavioral health for aggravated schizophrenia with aggressive behavior and hallucinations. Unfortunately, he states that he will deny all attempts at physical examination or blood draws. I will hold off on actually ordering the labs discussed above, as the patient has stated he will refuse lab draws. I will leave further evaluation to the discretion of the primary team, psychiatry.   TIME SPENT ON CONSULTATION: 30 minutes.     ____________________________ Ena Dawley. Clent Ridges, MD cpw:at D: 01/28/2014 17:25:38 ET T: 01/28/2014 17:40:36 ET JOB#: [id number]  cc: Santina Evans P. Clent Ridges, MD, <Dictator> Gale Journey MD ELECTRONICALLY SIGNED 02/03/2014 13:19
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
## 2014-08-18 ENCOUNTER — Observation Stay
Admission: EM | Admit: 2014-08-18 | Discharge: 2014-08-19 | Payer: Medicaid Other | Attending: Internal Medicine | Admitting: Internal Medicine

## 2014-08-18 ENCOUNTER — Encounter: Payer: Self-pay | Admitting: Emergency Medicine

## 2014-08-18 DIAGNOSIS — R4689 Other symptoms and signs involving appearance and behavior: Secondary | ICD-10-CM

## 2014-08-18 DIAGNOSIS — F17210 Nicotine dependence, cigarettes, uncomplicated: Secondary | ICD-10-CM | POA: Insufficient documentation

## 2014-08-18 DIAGNOSIS — D649 Anemia, unspecified: Secondary | ICD-10-CM | POA: Diagnosis not present

## 2014-08-18 DIAGNOSIS — R451 Restlessness and agitation: Secondary | ICD-10-CM | POA: Diagnosis not present

## 2014-08-18 DIAGNOSIS — F203 Undifferentiated schizophrenia: Secondary | ICD-10-CM

## 2014-08-18 DIAGNOSIS — Z79899 Other long term (current) drug therapy: Secondary | ICD-10-CM | POA: Insufficient documentation

## 2014-08-18 DIAGNOSIS — R739 Hyperglycemia, unspecified: Secondary | ICD-10-CM | POA: Insufficient documentation

## 2014-08-18 DIAGNOSIS — K5090 Crohn's disease, unspecified, without complications: Secondary | ICD-10-CM | POA: Insufficient documentation

## 2014-08-18 DIAGNOSIS — E871 Hypo-osmolality and hyponatremia: Secondary | ICD-10-CM | POA: Diagnosis not present

## 2014-08-18 DIAGNOSIS — Z720 Tobacco use: Secondary | ICD-10-CM

## 2014-08-18 DIAGNOSIS — E559 Vitamin D deficiency, unspecified: Secondary | ICD-10-CM | POA: Diagnosis not present

## 2014-08-18 DIAGNOSIS — F209 Schizophrenia, unspecified: Secondary | ICD-10-CM | POA: Diagnosis present

## 2014-08-18 LAB — URINALYSIS COMPLETE WITH MICROSCOPIC (ARMC ONLY)
BILIRUBIN URINE: NEGATIVE
Bacteria, UA: NONE SEEN
Glucose, UA: NEGATIVE mg/dL
Hgb urine dipstick: NEGATIVE
LEUKOCYTES UA: NEGATIVE
NITRITE: NEGATIVE
PROTEIN: 100 mg/dL — AB
SQUAMOUS EPITHELIAL / LPF: NONE SEEN
Specific Gravity, Urine: 1.019 (ref 1.005–1.030)
pH: 5 (ref 5.0–8.0)

## 2014-08-18 LAB — COMPREHENSIVE METABOLIC PANEL
ALT: 9 U/L — ABNORMAL LOW (ref 17–63)
ANION GAP: 8 (ref 5–15)
AST: 16 U/L (ref 15–41)
Albumin: 3.4 g/dL — ABNORMAL LOW (ref 3.5–5.0)
Alkaline Phosphatase: 79 U/L (ref 38–126)
BUN: 8 mg/dL (ref 6–20)
CO2: 24 mmol/L (ref 22–32)
Calcium: 10.2 mg/dL (ref 8.9–10.3)
Chloride: 91 mmol/L — ABNORMAL LOW (ref 101–111)
Creatinine, Ser: 0.89 mg/dL (ref 0.61–1.24)
GFR calc Af Amer: >60 mL/min (ref >60)
GFR calc non Af Amer: >60 mL/min (ref >60)
GLUCOSE: 116 mg/dL — AB (ref 65–99)
Potassium: 3.9 mmol/L (ref 3.5–5.1)
SODIUM: 123 mmol/L — AB (ref 135–145)
TOTAL PROTEIN: 7.6 g/dL (ref 6.5–8.1)
Total Bilirubin: 0.6 mg/dL (ref 0.3–1.2)

## 2014-08-18 LAB — CBC WITH DIFFERENTIAL/PLATELET
BASOS ABS: 0 10*3/uL (ref 0–0.1)
BASOS PCT: 1 %
Eosinophils Absolute: 0.2 10*3/uL (ref 0–0.7)
Eosinophils Relative: 2 %
HCT: 31.8 % — ABNORMAL LOW (ref 40.0–52.0)
Hemoglobin: 10.7 g/dL — ABNORMAL LOW (ref 13.0–18.0)
LYMPHS PCT: 22 %
Lymphs Abs: 1.5 10*3/uL (ref 1.0–3.6)
MCH: 32.9 pg (ref 26.0–34.0)
MCHC: 33.5 g/dL (ref 32.0–36.0)
MCV: 98 fL (ref 80.0–100.0)
Monocytes Absolute: 0.5 10*3/uL (ref 0.2–1.0)
Monocytes Relative: 7 %
Neutro Abs: 4.5 10*3/uL (ref 1.4–6.5)
Neutrophils Relative %: 68 %
Platelets: 384 10*3/uL (ref 150–440)
RBC: 3.24 MIL/uL — ABNORMAL LOW (ref 4.40–5.90)
RDW: 13.4 % (ref 11.5–14.5)
WBC: 6.7 10*3/uL (ref 3.8–10.6)

## 2014-08-18 LAB — URINE DRUG SCREEN, QUALITATIVE (ARMC ONLY)
Amphetamines, Ur Screen: NOT DETECTED
BARBITURATES, UR SCREEN: NOT DETECTED
Benzodiazepine, Ur Scrn: NOT DETECTED
Cannabinoid 50 Ng, Ur ~~LOC~~: NOT DETECTED
Cocaine Metabolite,Ur ~~LOC~~: NOT DETECTED
MDMA (ECSTASY) UR SCREEN: NOT DETECTED
Methadone Scn, Ur: NOT DETECTED
OPIATE, UR SCREEN: NOT DETECTED
Phencyclidine (PCP) Ur S: NOT DETECTED
Tricyclic, Ur Screen: NOT DETECTED

## 2014-08-18 LAB — VALPROIC ACID LEVEL: VALPROIC ACID LVL: 75 ug/mL (ref 50.0–100.0)

## 2014-08-18 LAB — OSMOLALITY, URINE: Osmolality, Ur: 537 mOsm/kg (ref 300–900)

## 2014-08-18 LAB — ETHANOL: Alcohol, Ethyl (B): <5 mg/dL (ref <5)

## 2014-08-18 MED ORDER — BENZTROPINE MESYLATE 1 MG PO TABS
1.0000 mg | ORAL_TABLET | Freq: Two times a day (BID) | ORAL | Status: DC
  Administered 2014-08-18 – 2014-08-19 (×2): 1 mg via ORAL
  Filled 2014-08-18: qty 2
  Filled 2014-08-18: qty 1

## 2014-08-18 MED ORDER — NICOTINE 10 MG IN INHA
1.0000 | RESPIRATORY_TRACT | Status: DC | PRN
  Filled 2014-08-18: qty 36

## 2014-08-18 MED ORDER — FERROUS SULFATE 325 (65 FE) MG PO TABS
325.0000 mg | ORAL_TABLET | Freq: Every day | ORAL | Status: DC
  Administered 2014-08-19: 325 mg via ORAL
  Filled 2014-08-18 (×3): qty 1

## 2014-08-18 MED ORDER — DIVALPROEX SODIUM 250 MG PO DR TAB
1000.0000 mg | DELAYED_RELEASE_TABLET | Freq: Two times a day (BID) | ORAL | Status: DC
  Administered 2014-08-18 – 2014-08-19 (×2): 1000 mg via ORAL
  Filled 2014-08-18: qty 4

## 2014-08-18 MED ORDER — HALOPERIDOL 2 MG PO TABS
5.0000 mg | ORAL_TABLET | Freq: Four times a day (QID) | ORAL | Status: DC | PRN
  Filled 2014-08-18: qty 2

## 2014-08-18 MED ORDER — ACETAMINOPHEN 325 MG PO TABS: 650.0000 mg | ORAL_TABLET | Freq: Four times a day (QID) | ORAL | Status: DC | PRN

## 2014-08-18 MED ORDER — HEPARIN SODIUM (PORCINE) 5000 UNIT/ML IJ SOLN: 5000.0000 [IU] | Freq: Three times a day (TID) | INTRAMUSCULAR | Status: DC

## 2014-08-18 MED ORDER — DIVALPROEX SODIUM 500 MG PO DR TAB
DELAYED_RELEASE_TABLET | ORAL | Status: AC
  Administered 2014-08-18: 1000 mg via ORAL
  Filled 2014-08-18: qty 2

## 2014-08-18 MED ORDER — ACETAMINOPHEN 650 MG RE SUPP: 650.0000 mg | Freq: Four times a day (QID) | RECTAL | Status: DC | PRN

## 2014-08-18 MED ORDER — OLANZAPINE 10 MG PO TABS
30.0000 mg | ORAL_TABLET | Freq: Every day | ORAL | Status: DC
  Administered 2014-08-18: 30 mg via ORAL
  Filled 2014-08-18 (×2): qty 3

## 2014-08-18 MED ORDER — VITAMIN D3 25 MCG (1000 UNIT) PO TABS
1000.0000 [IU] | ORAL_TABLET | Freq: Every day | ORAL | Status: DC
  Administered 2014-08-18 – 2014-08-19 (×2): 1000 [IU] via ORAL
  Filled 2014-08-18 (×4): qty 1

## 2014-08-18 MED ORDER — DOCUSATE SODIUM 100 MG PO CAPS
100.0000 mg | ORAL_CAPSULE | Freq: Two times a day (BID) | ORAL | Status: DC
  Administered 2014-08-19: 100 mg via ORAL
  Filled 2014-08-18: qty 1
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
## 2014-08-18 NOTE — ED Notes (Signed)
BEHAVIORAL HEALTH ROUNDING Patient sleeping: No. Patient alert and oriented: yes Behavior appropriate: Yes.  ;  Nutrition and fluids offered: Yes  Toileting and hygiene offered: Yes  Sitter present: yes Law enforcement present: Yes  
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
## 2014-08-18 NOTE — Consult Note (Signed)
[facility] Face-to-Face Psychiatry Consult   Reason for Consult:  Consult for this 54 year old man with a history of schizophrenia. Patient has been violently threatening and agitated at his group home. Under commitment papers. Referring Physician:  schavitz Patient Identification: Jeff Long MRN:  [id number] Principal Diagnosis: Schizophrenia Diagnosis:   Patient Active Problem List   Diagnosis Date Noted  . Schizophrenia [F20.9] 08/18/2014  . Agitation [R45.1] 08/18/2014  . Hyponatremia [E87.1] 08/18/2014    Total Time spent with patient: 1 hour  Subjective:   Jeff Long is a 54 y.o. male patient admitted with "I'm being harassed". Patient has a long history of schizophrenia and has recently been more paranoid and agitated and aggressive at his group home.  HPI:  History obtained from the patient and the chart. Patient claims that he is being harassed by people at his group home were only 54 years old. He is a very poor historian and his information doesn't all hold together. He claims that people have been insulting him and insulting his relatives. Says they've been putting poop in his food. He says that he's been having some trouble sleeping. Mood is been upset and angry. He claims however that he has not threatened anyone and has not been violent. Denies any suicidal ideation. He denies he's having any hallucinations. He says that he has been compliant with all of his medicine. Denies any alcohol or drug abuse. Sounds like may be there is been a new person that has come into the group home. Otherwise no known new stress. Chronic medical problems. As usual he says that he drinks a great deal of fluid although it's hard to pin him down on exactly how much.  Past psychiatric history: Long history of schizophrenia with multiple hospitalizations at both private and state hospitals. Patient has been somewhat stable on a combination of Depakote Haldol shots and Zyprexa although that's a relative  thing. He still comes into the hospital on average a couple times a year. No history of suicide attempts. Does have a history of aggression and intimidation.  Social history patient is a resident of a group home. His mother and sister are living and he tries to stay in contact with him but is unable to live with them because of his illness. No children of his own. Obviously unable to work.  Medical history: Patient has a chronic anemia also a chronic tendency towards hyponatremia. 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 Also has vitamin D deficiency.  Family history: No known family history  Substance abuse history: Patient denies alcohol or drug abuse and does not have a history of a substance abuse problem. HPI Elements:   Quality:  Agitation paranoia psychosis. Severity:  Moderate to severe. Timing:  Seems like it's probably been worse for a few days. Duration:  Chronic problem these exacerbations usually last a week or so. Context:  Not clear but possibly a new person living at his group home.  Past Medical History:  Past Medical History  Diagnosis Date  . Schizophrenia   . Crohn's disease    History reviewed. No pertinent past surgical history. Family History: No family history on file. Social History:  History  Alcohol Use No     History  Drug Use No    History   Social History  . Marital Status: Single    Spouse Name: N/A  . Number of Children: N/A  . Years of Education: N/A  Social History Main Topics  . Smoking status: Current Every Day Smoker -- 0.50 packs/day  . Smokeless tobacco: Not on file  . Alcohol Use: No  . Drug Use: No  . Sexual Activity: Not on file   Other Topics Concern  . None   Social History Narrative   Additional Social History:                          Allergies:   Allergies  Allergen Reactions  . Lithium Other (See Comments)    Makes me agitated  . Thorazine [Chlorpromazine] Other  (See Comments)    Makes me agitated    Labs:  Results for orders placed or performed during the hospital encounter of 08/18/14 (from the past 48 hour(s))  CBC WITH DIFFERENTIAL     Status: Abnormal   Collection Time: 08/18/14  5:11 PM  Result Value Ref Range   WBC 6.7 3.8 - 10.6 K/uL   RBC 3.24 (L) 4.40 - 5.90 MIL/uL   Hemoglobin 10.7 (L) 13.0 - 18.0 g/dL   HCT 31.8 (L) 40.0 - 52.0 %   MCV 98.0 80.0 - 100.0 fL   MCH 32.9 26.0 - 34.0 pg   MCHC 33.5 32.0 - 36.0 g/dL   RDW 13.4 11.5 - 14.5 %   Platelets 384 150 - 440 K/uL   Neutrophils Relative % 68 %   Neutro Abs 4.5 1.4 - 6.5 K/uL   Lymphocytes Relative 22 %   Lymphs Abs 1.5 1.0 - 3.6 K/uL   Monocytes Relative 7 %   Monocytes Absolute 0.5 0.2 - 1.0 K/uL   Eosinophils Relative 2 %   Eosinophils Absolute 0.2 0 - 0.7 K/uL   Basophils Relative 1 %   Basophils Absolute 0.0 0 - 0.1 K/uL  Comprehensive metabolic panel     Status: Abnormal   Collection Time: 08/18/14  5:11 PM  Result Value Ref Range   Sodium 123 (L) 135 - 145 mmol/L   Potassium 3.9 3.5 - 5.1 mmol/L   Chloride 91 (L) 101 - 111 mmol/L   CO2 24 22 - 32 mmol/L   Glucose, Bld 116 (H) 65 - 99 mg/dL   BUN 8 6 - 20 mg/dL   Creatinine, Ser 0.89 0.61 - 1.24 mg/dL   Calcium 10.2 8.9 - 10.3 mg/dL   Total Protein 7.6 6.5 - 8.1 g/dL   Albumin 3.4 (L) 3.5 - 5.0 g/dL   AST 16 15 - 41 U/L   ALT 9 (L) 17 - 63 U/L   Alkaline Phosphatase 79 38 - 126 U/L   Total Bilirubin 0.6 0.3 - 1.2 mg/dL   GFR calc non Af Amer >60 >60 mL/min   GFR calc Af Amer >60 >60 mL/min    Comment: (NOTE) The eGFR has been calculated using the CKD EPI equation. This calculation has not been validated in all clinical situations. eGFR's persistently <60 mL/min signify possible Chronic Kidney Disease.    Anion gap 8 5 - 15  Ethanol     Status: None   Collection Time: 08/18/14  5:11 PM  Result Value Ref Range   Alcohol, Ethyl (B) <5 <5 mg/dL    Comment:        LOWEST DETECTABLE LIMIT FOR SERUM  ALCOHOL IS 11 mg/dL FOR MEDICAL PURPOSES ONLY   Urinalysis complete, with microscopic [facility])     Status: Abnormal   Collection Time: 08/18/14  5:12 PM  Result Value Ref Range  Color, Urine YELLOW (A) YELLOW   APPearance CLEAR (A) CLEAR   Glucose, UA NEGATIVE NEGATIVE mg/dL   Bilirubin Urine NEGATIVE NEGATIVE   Ketones, ur TRACE (A) NEGATIVE mg/dL   Specific Gravity, Urine 1.019 1.005 - 1.030   Hgb urine dipstick NEGATIVE NEGATIVE   pH 5.0 5.0 - 8.0   Protein, ur 100 (A) NEGATIVE mg/dL   Nitrite NEGATIVE NEGATIVE   Leukocytes, UA NEGATIVE NEGATIVE   RBC / HPF 0-5 0 - 5 RBC/hpf   WBC, UA 0-5 0 - 5 WBC/hpf   Bacteria, UA NONE SEEN NONE SEEN   Squamous Epithelial / LPF NONE SEEN NONE SEEN   Mucous PRESENT   Urine Drug Screen, Qual[facility])     Status: None   Collection Time: 08/18/14  5:12 PM  Result Value Ref Range   Tricyclic, Ur Screen NONE DETECTED NONE DETECTED   Amphetamines, Ur Screen NONE DETECTED NONE DETECTED   MDMA (Ecstasy)Ur Screen NONE DETECTED NONE DETECTED   Cocaine Metabolite,Ur [place] NONE DETECTED NONE DETECTED   Opiate, Ur Screen NONE DETECTED NONE DETECTED   Phencyclidine (PCP) Ur S NONE DETECTED NONE DETECTED   Cannabinoid 50 Ng, Ur [place] NONE DETECTED NONE DETECTED   Barbiturates, Ur Screen NONE DETECTED NONE DETECTED   Benzodiazepine, Ur Scrn NONE DETECTED NONE DETECTED   Methadone Scn, Ur NONE DETECTED NONE DETECTED    Comment: (NOTE) 283  Tricyclics, urine               Cutoff 1000 ng/mL 200  Amphetamines, urine             Cutoff 1000 ng/mL 300  MDMA (Ecstasy), urine           Cutoff 500 ng/mL 400  Cocaine Metabolite, urine       Cutoff 300 ng/mL 500  Opiate, urine                   Cutoff 300 ng/mL 600  Phencyclidine (PCP), urine      Cutoff 25 ng/mL 700  Cannabinoid, urine              Cutoff 50 ng/mL 800  Barbiturates, urine             Cutoff 200 ng/mL 900  Benzodiazepine, urine           Cutoff 200 ng/mL 1000 Methadone, urine                 Cutoff 300 ng/mL 1100 1200 The urine drug screen provides only a preliminary, unconfirmed 1300 analytical test result and should not be used for non-medical 1400 purposes. Clinical consideration and professional judgment should 1500 be applied to any positive drug screen result due to possible 1600 interfering substances. A more specific alternate chemical method 1700 must be used in order to obtain a confirmed analytical result.  1800 Gas chromato graphy / mass spectrometry (GC/MS) is the preferred 1900 confirmatory method.     Vitals: Blood pressure 105/71, pulse 108, temperature 99.5 F (37.5 C), temperature source Oral, resp. rate 20, height _0  (1.803 m), weight 84.823 kg (187 lb), SpO2 97 %.  Risk to Self: Is patient at risk for suicide?: No, but patient needs Medical Clearance Risk to Others:   Prior Inpatient Therapy:   Prior Outpatient Therapy:    Current Facility-Administered Medications  Medication Dose Route Frequency Provider Last Rate Last Dose  . benztropine (COGENTIN) tablet 1 mg  1 mg Oral BID Gonzella Lex, MD      .  cholecalciferol (VITAMIN D) tablet 1,000 Units  1,000 Units Oral Daily Gonzella Lex, MD      . divalproex (DEPAKOTE) DR tablet 1,000 mg  1,000 mg Oral Q12H Gonzella Lex, MD      . Derrill Memo ON 08/19/2014] ferrous sulfate tablet 325 mg  325 mg Oral Q breakfast Gonzella Lex, MD      . haloperidol (HALDOL) tablet 5 mg  5 mg Oral Q6H PRN Gonzella Lex, MD      . OLANZapine (ZYPREXA) tablet 30 mg  30 mg Oral QHS Gonzella Lex, MD       Current Outpatient Prescriptions  Medication Sig Dispense Refill  . benztropine (COGENTIN) 1 MG tablet Take 1 mg by mouth 2 (two) times daily.    . divalproex (DEPAKOTE ER) 500 MG 24 hr tablet Take 500 mg by mouth every evening.     . ferrous sulfate 325 (65 FE) MG tablet Take 325 mg by mouth daily with breakfast.    . haloperidol (HALDOL) 5 MG tablet Take 5 mg by mouth 2 (two) times daily.    Marland Kitchen OLANZapine (ZYPREXA)  20 MG tablet Take 20 mg by mouth at bedtime.      Musculoskeletal: Strength & Muscle Tone: within normal limits Gait & Station: normal Patient leans: N/A  Psychiatric Specialty Exam: Physical Exam  Constitutional: He appears well-developed and well-nourished.  HENT:  Head: Normocephalic and atraumatic.  Eyes: Conjunctivae are normal. Pupils are equal, round, and reactive to light.  Neck: Normal range of motion.  Cardiovascular: Normal heart sounds.   Respiratory: Effort normal.  GI: Soft.  Musculoskeletal: Normal range of motion.  Neurological: He is alert.  Skin: Skin is warm and dry.  Psychiatric: His affect is labile. His speech is rapid and/or pressured. He is agitated. Thought content is paranoid and delusional. Cognition and memory are impaired. He expresses impulsivity.    Review of Systems  Constitutional: Negative.   HENT: Negative.   Eyes: Negative.   Respiratory: Negative.   Cardiovascular: Negative.   Gastrointestinal: Negative.   Musculoskeletal: Negative.   Skin: Negative.   Neurological: Negative.   Psychiatric/Behavioral: Negative for depression, suicidal ideas, hallucinations, memory loss and substance abuse. The patient is nervous/anxious and has insomnia.     Blood pressure 105/71, pulse 108, temperature 99.5 F (37.5 C), temperature source Oral, resp. rate 20, height _0  (1.803 m), weight 84.823 kg (187 lb), SpO2 97 %.Body mass index is 26.09 kg/(m^2).  General Appearance: Disheveled and Guarded  Eye Contact::  Good  Speech:  Garbled and Pressured  Volume:  Increased  Mood:  Anxious and Irritable  Affect:  Labile  Thought Process:  Circumstantial, Loose and Tangential  Orientation:  Full (Time, Place, and Person)  Thought Content:  Delusions  Suicidal Thoughts:  No  Homicidal Thoughts:  No  Memory:  Immediate;   Good Recent;   Fair Remote;   Fair  Judgement:  Impaired  Insight:  Shallow  Psychomotor Activity:  Decreased  Concentration:   Poor  Recall:  Poor  Fund of Knowledge:Poor  Language: Fair  Akathisia:  No  Handed:  Right  AIMS (if indicated):     Assets:  Communication Skills Housing Physical Health Resilience  ADL's:  Intact  Cognition: WNL  Sleep:      Medical Decision Making: Review of Psycho-Social Stressors (1), Review or order clinical lab tests (1), Established Problem, Worsening (2), Review of Last Therapy Session (1), Review or order medicine tests (1), Review  of Medication Regimen & Side Effects (2) and Review of New Medication or Change in Dosage (2)  Treatment Plan Summary: Medication management and Plan Mr. Ancheta presents with a decompensation into worsening paranoia psychosis and agitation. In this condition he can be very intimidating and potentially violent. Unfortunately this tends to happen with him even when he is compliant with medication. He does not abuse substances either. There may or may not have even been a particular stress bothering him. Sometimes he just gets like this. Patient requires inpatient hospitalization to avoid danger to other residents at his group home. In addition he has his chronic anemia which seems to be about at baseline. His sodium is only 123 which is on the low side even for him and probably needs to be watched.  Plan:  Recommend psychiatric Inpatient admission when medically cleared. Discussed crisis plan, support from social network, calling 911, coming to the Emergency Department, and calling Suicide Hotline. Case discussed with emergency room physician. I will order another chemistry panel for the morning. Restart his Zyprexa 30 mg at night, Depakote thousand milligrams twice a day, Cogentin 1 mg twice a day with when necessary Haldol and continue his vitamin D and iron supplements. If his sodium is staying stable we can consider likely admission tomorrow Disposition: Continue involuntary commitment. Treatment as above.  Vegas Fritze 08/18/2014 7:04 PM

## 2014-08-19 ENCOUNTER — Inpatient Hospital Stay
Admission: EM | Admit: 2014-08-19 | Discharge: 2014-08-23 | DRG: 885 | Payer: Medicaid Other | Source: Intra-hospital | Attending: Psychiatry | Admitting: Psychiatry

## 2014-08-19 DIAGNOSIS — E871 Hypo-osmolality and hyponatremia: Secondary | ICD-10-CM | POA: Diagnosis present

## 2014-08-19 DIAGNOSIS — D649 Anemia, unspecified: Secondary | ICD-10-CM | POA: Diagnosis present

## 2014-08-19 DIAGNOSIS — F203 Undifferentiated schizophrenia: Secondary | ICD-10-CM | POA: Diagnosis not present

## 2014-08-19 DIAGNOSIS — K5090 Crohn's disease, unspecified, without complications: Secondary | ICD-10-CM | POA: Diagnosis present

## 2014-08-19 DIAGNOSIS — Z720 Tobacco use: Secondary | ICD-10-CM | POA: Diagnosis present

## 2014-08-19 DIAGNOSIS — J45909 Unspecified asthma, uncomplicated: Secondary | ICD-10-CM | POA: Diagnosis present

## 2014-08-19 DIAGNOSIS — R631 Polydipsia: Secondary | ICD-10-CM | POA: Diagnosis present

## 2014-08-19 DIAGNOSIS — F209 Schizophrenia, unspecified: Secondary | ICD-10-CM | POA: Diagnosis present

## 2014-08-19 DIAGNOSIS — F17210 Nicotine dependence, cigarettes, uncomplicated: Secondary | ICD-10-CM | POA: Diagnosis present

## 2014-08-19 DIAGNOSIS — I959 Hypotension, unspecified: Secondary | ICD-10-CM | POA: Diagnosis present

## 2014-08-19 DIAGNOSIS — R451 Restlessness and agitation: Secondary | ICD-10-CM

## 2014-08-19 DIAGNOSIS — Z79899 Other long term (current) drug therapy: Secondary | ICD-10-CM

## 2014-08-19 DIAGNOSIS — F200 Paranoid schizophrenia: Secondary | ICD-10-CM | POA: Diagnosis not present

## 2014-08-19 DIAGNOSIS — E559 Vitamin D deficiency, unspecified: Secondary | ICD-10-CM | POA: Diagnosis not present

## 2014-08-19 HISTORY — DX: Unspecified asthma, uncomplicated: J45.909

## 2014-08-19 LAB — BASIC METABOLIC PANEL
ANION GAP: 4 — AB (ref 5–15)
BUN: 6 mg/dL (ref 6–20)
CALCIUM: 9.7 mg/dL (ref 8.9–10.3)
CO2: 26 mmol/L (ref 22–32)
CREATININE: 0.71 mg/dL (ref 0.61–1.24)
Chloride: 97 mmol/L — ABNORMAL LOW (ref 101–111)
GFR calc Af Amer: >60 mL/min (ref >60)
GFR calc non Af Amer: >60 mL/min (ref >60)
GLUCOSE: 81 mg/dL (ref 65–99)
POTASSIUM: 3.9 mmol/L (ref 3.5–5.1)
Sodium: 127 mmol/L — ABNORMAL LOW (ref 135–145)

## 2014-08-19 LAB — HEMOGLOBIN A1C: Hgb A1c MFr Bld: 4.8 % (ref 4.0–6.0)

## 2014-08-19 MED ORDER — OLANZAPINE 10 MG PO TABS
30.0000 mg | ORAL_TABLET | Freq: Every day | ORAL | Status: DC
  Administered 2014-08-19 – 2014-08-22 (×4): 30 mg via ORAL
  Filled 2014-08-19 (×4): qty 3

## 2014-08-19 MED ORDER — VITAMIN D 1000 UNITS PO TABS
1000.0000 [IU] | ORAL_TABLET | Freq: Every day | ORAL | Status: DC
  Administered 2014-08-20 – 2014-08-23 (×4): 1000 [IU] via ORAL
  Filled 2014-08-19 (×4): qty 1

## 2014-08-19 MED ORDER — ALUM & MAG HYDROXIDE-SIMETH 200-200-20 MG/5ML PO SUSP
30.0000 mL | ORAL | Status: DC | PRN
Start: 2014-08-19 — End: 2014-08-23

## 2014-08-19 MED ORDER — MAGNESIUM HYDROXIDE 400 MG/5ML PO SUSP: 30.0000 mL | Freq: Every day | ORAL | Status: DC | PRN

## 2014-08-19 MED ORDER — FERROUS SULFATE 325 (65 FE) MG PO TABS
325.0000 mg | ORAL_TABLET | Freq: Every day | ORAL | Status: DC
Start: 2014-08-20 — End: 2014-08-23
  Administered 2014-08-20 – 2014-08-23 (×4): 325 mg via ORAL
  Filled 2014-08-19 (×4): qty 1

## 2014-08-19 MED ORDER — BENZTROPINE MESYLATE 1 MG PO TABS
1.0000 mg | ORAL_TABLET | Freq: Three times a day (TID) | ORAL | Status: DC
  Administered 2014-08-19 – 2014-08-22 (×8): 1 mg via ORAL
  Filled 2014-08-19 (×9): qty 1

## 2014-08-19 MED ORDER — ACETAMINOPHEN 325 MG PO TABS: 650.0000 mg | ORAL_TABLET | Freq: Four times a day (QID) | ORAL | Status: DC | PRN

## 2014-08-19 MED ORDER — DIVALPROEX SODIUM 500 MG PO DR TAB
500.0000 mg | DELAYED_RELEASE_TABLET | Freq: Two times a day (BID) | ORAL | Status: DC
  Administered 2014-08-19 – 2014-08-23 (×8): 500 mg via ORAL
  Filled 2014-08-19 (×9): qty 1

## 2014-08-19 NOTE — Progress Notes (Signed)
54 yr old male admitted from medical floor for schizophrenia.He had a history of schizophrenia.He is from a group home.Rated his depression 8/10. He denies suicidal & homicidal ideation.Denies hallucination.States "I have lots of stress,I don't know the reason."No contraband found on admission.

## 2014-08-19 NOTE — Plan of Care (Signed)
Problem: Consults Goal: General Medical Patient Education See Patient Education Module for specific education.  Outcome: Not Met (add Reason) Patient was sleeping, did not engage when discussing education Goal: Nutrition Consult-if indicated Outcome: Not Applicable Date Met:  35/43/01 Nutrition consult not needed at this time

## 2014-08-19 NOTE — Tx Team (Addendum)
Initial Interdisciplinary Treatment Plan   PATIENT STRESSORS: Medication change or noncompliance Traumatic event   PATIENT STRENGTHS: Communication skills Supportive family/friends   PROBLEM LIST: Problem List/Patient Goals Date to be addressed Date deferred Reason deferred Estimated date of resolution  Schizophrenia 08/19/2014     Agitation 08/19/2014     Depression 08/19/2014                                          DISCHARGE CRITERIA:  Ability to meet basic life and health needs  PRELIMINARY DISCHARGE PLAN: Return to previous living arrangement  PATIENT/FAMIILY INVOLVEMENT: This treatment plan has been presented to and reviewed with the patient, [facility], and/or family member,   The patient and family have been given the opportunity to ask questions and make suggestions.  Margo Common Nikesh Teschner 08/19/2014, 3:32 PM

## 2014-08-19 NOTE — BHH Suicide Risk Assessment (Signed)
[facility] Admission Suicide Risk Assessment   Nursing information obtained from:    Demographic factors:  Male, Unemployed Current Mental Status:  NA Loss Factors:  NA Historical Factors:  Impulsivity Risk Reduction Factors:  NA Total Time spent with patient: 45 minutes Principal Problem: Schizophrenia Diagnosis:   Patient Active Problem List   Diagnosis Date Noted  . Schizophrenia [F20.9] 08/18/2014  . Agitation [R45.1] 08/18/2014  . Hyponatremia [E87.1] 08/18/2014  . Anemia [D64.9] 08/18/2014  . Vitamin D deficiency [E55.9] 08/18/2014  . Tobacco abuse [Z72.0] 08/18/2014     Continued Clinical Symptoms:  Alcohol Use Disorder Identification Test Final Score (AUDIT): 0 The "Alcohol Use Disorders Identification Test", Guidelines for Use in Primary Care, Second Edition.  World Science writer [facility]). Score between 0-7:  no or low risk or alcohol related problems. Score between 8-15:  moderate risk of alcohol related problems. Score between 16-19:  high risk of alcohol related problems. Score 20 or above:  warrants further diagnostic evaluation for alcohol dependence and treatment.   CLINICAL FACTORS:   Schizophrenia:   Paranoid or undifferentiated type   Musculoskeletal: Strength & Muscle Tone: within normal limits Gait & Station: normal Patient leans: N/A  Psychiatric Specialty Exam: Physical Exam  Constitutional: He appears well-developed and well-nourished.  HENT:  Head: Normocephalic and atraumatic.  Eyes: Conjunctivae are normal. Pupils are equal, round, and reactive to light.  Neck: Normal range of motion.  Cardiovascular: Normal heart sounds.   Respiratory: Effort normal.  GI: Soft.  Musculoskeletal: Normal range of motion.  Neurological: He is alert.  Skin: Skin is warm and dry.  Psychiatric: Judgment and thought content normal. His affect is blunt. His speech is delayed. He is withdrawn. Cognition and memory are normal.  In contrast to yesterday the patient  is now withdrawn and calm. He denies paranoia. He is not agitated. Denies hallucinations. Says that now he would like to just go home. Alert and oriented 4.    Review of Systems  Constitutional: Negative.   HENT: Negative.   Eyes: Negative.   Respiratory: Negative.   Cardiovascular: Negative.   Gastrointestinal: Negative.   Musculoskeletal: Negative.   Skin: Negative.   Neurological: Negative.   Psychiatric/Behavioral: Negative for depression, suicidal ideas, hallucinations, memory loss and substance abuse. The patient is not nervous/anxious and does not have insomnia.     Blood pressure 106/78, pulse 88, temperature 98.2 F (36.8 C), temperature source Oral, resp. rate 18, height 5\' 11"  (1.803 m), weight 63.504 kg (140 lb), SpO2 98 %.Body mass index is 19.53 kg/(m^2).  General Appearance: Guarded  Eye Contact::  Fair  Speech:  Slow  Volume:  Decreased  Mood:  Euthymic  Affect:  Blunt  Thought Process:  Simple and concrete  Orientation:  Full (Time, Place, and Person)  Thought Content:  Denies hallucinations denies feeling paranoid  Suicidal Thoughts:  No  Homicidal Thoughts:  No  Memory:  Immediate;   Fair Recent;   Fair Remote;   Fair  Judgement:  Intact  Insight:  Shallow  Psychomotor Activity:  Normal  Concentration:  Fair  Recall:  [employer] of Knowledge:Fair  Language: Fair  Akathisia:  No  Handed:  Right  AIMS (if indicated):     Assets:  Desire for Improvement Housing Physical Health  Sleep:     Cognition: WNL  ADL's:  Intact     COGNITIVE FEATURES THAT CONTRIBUTE TO RISK:  Thought constriction (tunnel vision)    SUICIDE RISK:   Minimal: No identifiable suicidal  ideation.  Patients presenting with no risk factors but with morbid ruminations; may be classified as minimal risk based on the severity of the depressive symptoms  PLAN OF CARE: Patient has been admitted to the psychiatric ward after a brief stay on the internal medicine ward. He presented to  the hospital very agitated paranoid and slightly hostile. He also was even more hyponatremic than usual. He has now been transferred back to psychiatry. No acute evidence of suicidal ideation. Continue 15 minute checks for now continue outpatient medicine. Daily evaluation by psychiatry and nursing team.  Medical Decision Making:  Established Problem, Stable/Improving (1), Review of Psycho-Social Stressors (1), Review or order clinical lab tests (1), Review and summation of old records (2), Review or order medicine tests (1) and Review of Medication Regimen & Side Effects (2)  I certify that inpatient services furnished can reasonably be expected to improve the patient's condition.   Kielyn Kardell 08/19/2014, 8:29 PM

## 2014-08-19 NOTE — BHH Counselor (Addendum)
[facility] is the [facility] who managed pt. Medicaid. Writer is unable to access [employer] to submit TAR.  Called [employer]([phone]) to verify it. 

## 2014-08-19 NOTE — Care Management (Signed)
Patient will discharge to [facility] today.
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
## 2014-08-19 NOTE — H&P (Signed)
Psychiatric Admission Assessment Adult  Patient Identification: Jeff Long MRN:  [id number] Date of Evaluation:  08/19/2014 Chief Complaint:  Schizophrenia Principal Diagnosis: Schizophrenia Diagnosis:   Patient Active Problem List   Diagnosis Date Noted  . Schizophrenia [F20.9] 08/18/2014  . Agitation [R45.1] 08/18/2014  . Hyponatremia [E87.1] 08/18/2014  . Anemia [D64.9] 08/18/2014  . Vitamin D deficiency [E55.9] 08/18/2014  . Tobacco abuse [Z72.0] 08/18/2014   History of Present Illness:: Patient is a 54 year old man with a history of schizophrenia who presented to the emergency room from his group home yesterday very agitated hostile delusional and bizarre. This is a pretty typical presentation for him. It wasn't clear that he had been off his medicine or had any real acute distress. He did appear to be too agitated to go home. He also had a sodium of 123. The patient is chronically hyponatremic but that was a little little lower than his usual presentation. He was admitted to the medicine unit overnight but then had a sodium this morning of 127. He's been transferred down to psychiatry. She is now reporting that he feels fine. Denies hallucinations denies suicidal ideation and is not presenting with a kind of paranoid bizarre thinking that he had yesterday. He's been compliant with medication.  Past psychiatric history: This gentleman has a long history of schizophrenia and has had several prior admissions. This is actually a pretty typical presentation that he will come into the hospital paranoid and agitated making all kinds of bizarre statements but then calm down pretty quickly. He doesn't have a history of substance abuse. Medically he is suffering from chronic hyponatremia which may be due largely to polydipsia and also has a chronic anemia probably from poor diet. He is stable on his current medicine regimen.  Patient resides in a group home. Minimal family involvement.  No  substance abuse history. No known acute trauma or stress Elements:  Quality:  Delusional psychotic bizarre thinking and agitated behavior. Severity:  Fairly severe although he doesn't actually get physically violent. It is pretty dramatic however when he is being psychotic. Timing:  Worse over just the past couple days but now improving. Duration:  Usually last for just a day or 2 before clears up. Context:  Unknown. Possibly there was some kind of insult or conflict at his group home. Associated Signs/Symptoms: Depression Symptoms:  hypersomnia, psychomotor retardation, (Hypo) Manic Symptoms:  Flight of Ideas, Irritable Mood, Anxiety Symptoms:  Specific anxiety mostly about psychotic paranoid subjects Psychotic Symptoms:  Ideas of Reference, Paranoia, PTSD Symptoms: Negative Total Time spent with patient: 1 hour  Past Medical History:  Past Medical History  Diagnosis Date  . Schizophrenia   . Crohn's disease   . Asthma     Past Surgical History  Procedure Laterality Date  . No past surgeries     Family History: History reviewed. No pertinent family history. Social History:  History  Alcohol Use No     History  Drug Use No    History   Social History  . Marital Status: Single    Spouse Name: N/A  . Number of Children: N/A  . Years of Education: N/A   Social History Main Topics  . Smoking status: Current Every Day Smoker -- 0.50 packs/day  . Smokeless tobacco: Not on file  . Alcohol Use: No  . Drug Use: No  . Sexual Activity: Not on file   Other Topics Concern  . None   Social History Narrative   Additional Social History:  Musculoskeletal: Strength & Muscle Tone: within normal limits Gait & Station: normal Patient leans: N/A  Psychiatric Specialty Exam: Physical Exam  Constitutional: He appears well-developed and well-nourished.  HENT:  Head: Normocephalic and atraumatic.  Eyes: Conjunctivae are normal. Pupils  are equal, round, and reactive to light.  Neck: Normal range of motion.  Cardiovascular: Normal heart sounds.   Respiratory: Effort normal.  GI: Soft.  Musculoskeletal: Normal range of motion.  Neurological: He is alert.  Skin: Skin is warm and dry.  Psychiatric: Thought content normal. His affect is blunt. His speech is delayed. He is withdrawn. Cognition and memory are normal. He expresses impulsivity.  Mr. [place] appears to be much better than yesterday. He is not hostile or agitated. He is not speaking loudly. Instead he is staying in his room covered up by his blanket. Denies that he is having hallucinations. Denies suicidal or homicidal ideation. He still alert and oriented 4. Cognitive deficits typical of schizophrenia.    Review of Systems  Constitutional: Negative.   HENT: Negative.   Eyes: Negative.   Respiratory: Negative.   Cardiovascular: Negative.   Gastrointestinal: Negative.   Musculoskeletal: Negative.   Skin: Negative.   Neurological: Negative.   Psychiatric/Behavioral: Negative for depression, suicidal ideas, hallucinations, memory loss and substance abuse. The patient is not nervous/anxious and does not have insomnia.     Blood pressure 106/78, pulse 88, temperature 98.2 F (36.8 C), temperature source Oral, resp. rate 18, height 5' 11" (1.803 m), weight 63.504 kg (140 lb), SpO2 98 %.Body mass index is 19.53 kg/(m^2).  General Appearance: Disheveled  Eye Sport and exercise psychologist::  Fair  Speech:  Slow  Volume:  Decreased  Mood:  Euthymic  Affect:  Blunt  Thought Process:  Concrete and simple  Orientation:  Full (Time, Place, and Person)  Thought Content:  Paranoid Ideation and Currently he is not presenting with the same kind of obvious paranoid fear  Suicidal Thoughts:  No  Homicidal Thoughts:  No  Memory:  Immediate;   Fair Recent;   Fair Remote;   Fair  Judgement:  Fair  Insight:  Fair  Psychomotor Activity:  Decreased  Concentration:  Fair  Recall:  [employer] of  Knowledge:Fair  Language: Fair  Akathisia:  No  Handed:  Right  AIMS (if indicated):     Assets:  Financial Resources/Insurance Physical Health  ADL's:  Intact  Cognition: Impaired,  Mild  Sleep:      Risk to Self: Is patient at risk for suicide?: No Risk to Others:   Prior Inpatient Therapy:   Prior Outpatient Therapy:    Alcohol Screening: 1. How often do you have a drink containing alcohol?: Never 9. Have you or someone else been injured as a result of your drinking?: No 10. Has a relative or friend or a doctor or another health worker been concerned about your drinking or suggested you cut down?: No Alcohol Use Disorder Identification Test Final Score (AUDIT): 0 Brief Intervention: AUDIT score less than 7 or less-screening does not suggest unhealthy drinking-brief intervention not indicated  Allergies:   Allergies  Allergen Reactions  . Lithium Other (See Comments)    Reaction:  Agitation    . Thorazine [Chlorpromazine] Other (See Comments)    Reaction:  Agitation    Lab Results:  Results for orders placed or performed during the hospital encounter of 08/18/14 (from the past 48 hour(s))  CBC WITH DIFFERENTIAL     Status: Abnormal   Collection Time: 08/18/14  5:11 PM  Result Value Ref Range   WBC 6.7 3.8 - 10.6 K/uL   RBC 3.24 (L) 4.40 - 5.90 MIL/uL   Hemoglobin 10.7 (L) 13.0 - 18.0 g/dL   HCT 31.8 (L) 40.0 - 52.0 %   MCV 98.0 80.0 - 100.0 fL   MCH 32.9 26.0 - 34.0 pg   MCHC 33.5 32.0 - 36.0 g/dL   RDW 13.4 11.5 - 14.5 %   Platelets 384 150 - 440 K/uL   Neutrophils Relative % 68 %   Neutro Abs 4.5 1.4 - 6.5 K/uL   Lymphocytes Relative 22 %   Lymphs Abs 1.5 1.0 - 3.6 K/uL   Monocytes Relative 7 %   Monocytes Absolute 0.5 0.2 - 1.0 K/uL   Eosinophils Relative 2 %   Eosinophils Absolute 0.2 0 - 0.7 K/uL   Basophils Relative 1 %   Basophils Absolute 0.0 0 - 0.1 K/uL  Comprehensive metabolic panel     Status: Abnormal   Collection Time: 08/18/14  5:11 PM  Result  Value Ref Range   Sodium 123 (L) 135 - 145 mmol/L   Potassium 3.9 3.5 - 5.1 mmol/L   Chloride 91 (L) 101 - 111 mmol/L   CO2 24 22 - 32 mmol/L   Glucose, Bld 116 (H) 65 - 99 mg/dL   BUN 8 6 - 20 mg/dL   Creatinine, Ser 0.89 0.61 - 1.24 mg/dL   Calcium 10.2 8.9 - 10.3 mg/dL   Total Protein 7.6 6.5 - 8.1 g/dL   Albumin 3.4 (L) 3.5 - 5.0 g/dL   AST 16 15 - 41 U/L   ALT 9 (L) 17 - 63 U/L   Alkaline Phosphatase 79 38 - 126 U/L   Total Bilirubin 0.6 0.3 - 1.2 mg/dL   GFR calc non Af Amer >60 >60 mL/min   GFR calc Af Amer >60 >60 mL/min    Comment: (NOTE) The eGFR has been calculated using the CKD EPI equation. This calculation has not been validated in all clinical situations. eGFR's persistently <60 mL/min signify possible Chronic Kidney Disease.    Anion gap 8 5 - 15  Ethanol     Status: None   Collection Time: 08/18/14  5:11 PM  Result Value Ref Range   Alcohol, Ethyl (B) <5 <5 mg/dL    Comment:        LOWEST DETECTABLE LIMIT FOR SERUM ALCOHOL IS 11 mg/dL FOR MEDICAL PURPOSES ONLY   Valproic acid level     Status: None   Collection Time: 08/18/14  5:11 PM  Result Value Ref Range   Valproic Acid Lvl 75 50.0 - 100.0 ug/mL  Urinalysis complete, with microscopic [facility])     Status: Abnormal   Collection Time: 08/18/14  5:12 PM  Result Value Ref Range   Color, Urine YELLOW (A) YELLOW   APPearance CLEAR (A) CLEAR   Glucose, UA NEGATIVE NEGATIVE mg/dL   Bilirubin Urine NEGATIVE NEGATIVE   Ketones, ur TRACE (A) NEGATIVE mg/dL   Specific Gravity, Urine 1.019 1.005 - 1.030   Hgb urine dipstick NEGATIVE NEGATIVE   pH 5.0 5.0 - 8.0   Protein, ur 100 (A) NEGATIVE mg/dL   Nitrite NEGATIVE NEGATIVE   Leukocytes, UA NEGATIVE NEGATIVE   RBC / HPF 0-5 0 - 5 RBC/hpf   WBC, UA 0-5 0 - 5 WBC/hpf   Bacteria, UA NONE SEEN NONE SEEN   Squamous Epithelial / LPF NONE SEEN NONE SEEN   Mucous PRESENT   Urine Drug Screen, Qualitative [facility])  Status: None   Collection Time: 08/18/14  5:12 PM   Result Value Ref Range   Tricyclic, Ur Screen NONE DETECTED NONE DETECTED   Amphetamines, Ur Screen NONE DETECTED NONE DETECTED   MDMA (Ecstasy)Ur Screen NONE DETECTED NONE DETECTED   Cocaine Metabolite,Ur [place] NONE DETECTED NONE DETECTED   Opiate, Ur Screen NONE DETECTED NONE DETECTED   Phencyclidine (PCP) Ur S NONE DETECTED NONE DETECTED   Cannabinoid 50 Ng, Ur [place] NONE DETECTED NONE DETECTED   Barbiturates, Ur Screen NONE DETECTED NONE DETECTED   Benzodiazepine, Ur Scrn NONE DETECTED NONE DETECTED   Methadone Scn, Ur NONE DETECTED NONE DETECTED    Comment: (NOTE) 761  Tricyclics, urine               Cutoff 1000 ng/mL 200  Amphetamines, urine             Cutoff 1000 ng/mL 300  MDMA (Ecstasy), urine           Cutoff 500 ng/mL 400  Cocaine Metabolite, urine       Cutoff 300 ng/mL 500  Opiate, urine                   Cutoff 300 ng/mL 600  Phencyclidine (PCP), urine      Cutoff 25 ng/mL 700  Cannabinoid, urine              Cutoff 50 ng/mL 800  Barbiturates, urine             Cutoff 200 ng/mL 900  Benzodiazepine, urine           Cutoff 200 ng/mL 1000 Methadone, urine                Cutoff 300 ng/mL 1100 1200 The urine drug screen provides only a preliminary, unconfirmed 1300 analytical test result and should not be used for non-medical 1400 purposes. Clinical consideration and professional judgment should 1500 be applied to any positive drug screen result due to possible 1600 interfering substances. A more specific alternate chemical method 1700 must be used in order to obtain a confirmed analytical result.  1800 Gas chromato graphy / mass spectrometry (GC/MS) is the preferred 1900 confirmatory method.   Osmolality, urine     Status: None   Collection Time: 08/18/14  5:12 PM  Result Value Ref Range   Osmolality, Ur 537 300 - 900 mOsm/kg  Hemoglobin A1c     Status: None   Collection Time: 08/18/14  9:11 PM  Result Value Ref Range   Hgb A1c MFr Bld 4.8 4.0 - 6.0 %  Basic metabolic  panel     Status: Abnormal   Collection Time: 08/19/14  5:25 AM  Result Value Ref Range   Sodium 127 (L) 135 - 145 mmol/L   Potassium 3.9 3.5 - 5.1 mmol/L   Chloride 97 (L) 101 - 111 mmol/L   CO2 26 22 - 32 mmol/L   Glucose, Bld 81 65 - 99 mg/dL   BUN 6 6 - 20 mg/dL   Creatinine, Ser 0.71 0.61 - 1.24 mg/dL   Calcium 9.7 8.9 - 10.3 mg/dL   GFR calc non Af Amer >60 >60 mL/min   GFR calc Af Amer >60 >60 mL/min    Comment: (NOTE) The eGFR has been calculated using the CKD EPI equation. This calculation has not been validated in all clinical situations. eGFR's persistently <60 mL/min signify possible Chronic Kidney Disease.    Anion gap 4 (L) 5 - 15  Current Medications: No current facility-administered medications for this encounter.   PTA Medications: Prescriptions prior to admission  Medication Sig Dispense Refill Last Dose  . benztropine (COGENTIN) 1 MG tablet Take 1 mg by mouth 3 (three) times daily.    unknown at unknown  . cholecalciferol (VITAMIN D) 1000 UNITS tablet Take 1,000 Units by mouth daily.   unknown at unknown  . divalproex (DEPAKOTE) 500 MG DR tablet Take 500 mg by mouth 2 (two) times daily.   unknown at unknown  . ferrous sulfate 325 (65 FE) MG tablet Take 325 mg by mouth daily.    unknown at unknown  . OLANZapine (ZYPREXA) 20 MG tablet Take 30 mg by mouth at bedtime.    unknown at unknown    Previous Psychotropic Medications: Yes   Substance Abuse History in the last 12 months:  No.    Consequences of Substance Abuse: Negative  Results for orders placed or performed during the hospital encounter of 08/18/14 (from the past 72 hour(s))  CBC WITH DIFFERENTIAL     Status: Abnormal   Collection Time: 08/18/14  5:11 PM  Result Value Ref Range   WBC 6.7 3.8 - 10.6 K/uL   RBC 3.24 (L) 4.40 - 5.90 MIL/uL   Hemoglobin 10.7 (L) 13.0 - 18.0 g/dL   HCT 31.8 (L) 40.0 - 52.0 %   MCV 98.0 80.0 - 100.0 fL   MCH 32.9 26.0 - 34.0 pg   MCHC 33.5 32.0 - 36.0 g/dL    RDW 13.4 11.5 - 14.5 %   Platelets 384 150 - 440 K/uL   Neutrophils Relative % 68 %   Neutro Abs 4.5 1.4 - 6.5 K/uL   Lymphocytes Relative 22 %   Lymphs Abs 1.5 1.0 - 3.6 K/uL   Monocytes Relative 7 %   Monocytes Absolute 0.5 0.2 - 1.0 K/uL   Eosinophils Relative 2 %   Eosinophils Absolute 0.2 0 - 0.7 K/uL   Basophils Relative 1 %   Basophils Absolute 0.0 0 - 0.1 K/uL  Comprehensive metabolic panel     Status: Abnormal   Collection Time: 08/18/14  5:11 PM  Result Value Ref Range   Sodium 123 (L) 135 - 145 mmol/L   Potassium 3.9 3.5 - 5.1 mmol/L   Chloride 91 (L) 101 - 111 mmol/L   CO2 24 22 - 32 mmol/L   Glucose, Bld 116 (H) 65 - 99 mg/dL   BUN 8 6 - 20 mg/dL   Creatinine, Ser 0.89 0.61 - 1.24 mg/dL   Calcium 10.2 8.9 - 10.3 mg/dL   Total Protein 7.6 6.5 - 8.1 g/dL   Albumin 3.4 (L) 3.5 - 5.0 g/dL   AST 16 15 - 41 U/L   ALT 9 (L) 17 - 63 U/L   Alkaline Phosphatase 79 38 - 126 U/L   Total Bilirubin 0.6 0.3 - 1.2 mg/dL   GFR calc non Af Amer >60 >60 mL/min   GFR calc Af Amer >60 >60 mL/min    Comment: (NOTE) The eGFR has been calculated using the CKD EPI equation. This calculation has not been validated in all clinical situations. eGFR's persistently <60 mL/min signify possible Chronic Kidney Disease.    Anion gap 8 5 - 15  Ethanol     Status: None   Collection Time: 08/18/14  5:11 PM  Result Value Ref Range   Alcohol, Ethyl (B) <5 <5 mg/dL    Comment:        LOWEST DETECTABLE LIMIT FOR SERUM  ALCOHOL IS 11 mg/dL FOR MEDICAL PURPOSES ONLY   Valproic acid level     Status: None   Collection Time: 08/18/14  5:11 PM  Result Value Ref Range   Valproic Acid Lvl 75 50.0 - 100.0 ug/mL  Urinalysis complete, with microscopic [facility])     Status: Abnormal   Collection Time: 08/18/14  5:12 PM  Result Value Ref Range   Color, Urine YELLOW (A) YELLOW   APPearance CLEAR (A) CLEAR   Glucose, UA NEGATIVE NEGATIVE mg/dL   Bilirubin Urine NEGATIVE NEGATIVE   Ketones, ur TRACE (A)  NEGATIVE mg/dL   Specific Gravity, Urine 1.019 1.005 - 1.030   Hgb urine dipstick NEGATIVE NEGATIVE   pH 5.0 5.0 - 8.0   Protein, ur 100 (A) NEGATIVE mg/dL   Nitrite NEGATIVE NEGATIVE   Leukocytes, UA NEGATIVE NEGATIVE   RBC / HPF 0-5 0 - 5 RBC/hpf   WBC, UA 0-5 0 - 5 WBC/hpf   Bacteria, UA NONE SEEN NONE SEEN   Squamous Epithelial / LPF NONE SEEN NONE SEEN   Mucous PRESENT   Urine Drug Screen, Qualitative [facility])     Status: None   Collection Time: 08/18/14  5:12 PM  Result Value Ref Range   Tricyclic, Ur Screen NONE DETECTED NONE DETECTED   Amphetamines, Ur Screen NONE DETECTED NONE DETECTED   MDMA (Ecstasy)Ur Screen NONE DETECTED NONE DETECTED   Cocaine Metabolite,Ur [place] NONE DETECTED NONE DETECTED   Opiate, Ur Screen NONE DETECTED NONE DETECTED   Phencyclidine (PCP) Ur S NONE DETECTED NONE DETECTED   Cannabinoid 50 Ng, Ur [place] NONE DETECTED NONE DETECTED   Barbiturates, Ur Screen NONE DETECTED NONE DETECTED   Benzodiazepine, Ur Scrn NONE DETECTED NONE DETECTED   Methadone Scn, Ur NONE DETECTED NONE DETECTED    Comment: (NOTE) 169  Tricyclics, urine               Cutoff 1000 ng/mL 200  Amphetamines, urine             Cutoff 1000 ng/mL 300  MDMA (Ecstasy), urine           Cutoff 500 ng/mL 400  Cocaine Metabolite, urine       Cutoff 300 ng/mL 500  Opiate, urine                   Cutoff 300 ng/mL 600  Phencyclidine (PCP), urine      Cutoff 25 ng/mL 700  Cannabinoid, urine              Cutoff 50 ng/mL 800  Barbiturates, urine             Cutoff 200 ng/mL 900  Benzodiazepine, urine           Cutoff 200 ng/mL 1000 Methadone, urine                Cutoff 300 ng/mL 1100 1200 The urine drug screen provides only a preliminary, unconfirmed 1300 analytical test result and should not be used for non-medical 1400 purposes. Clinical consideration and professional judgment should 1500 be applied to any positive drug screen result due to possible 1600 interfering substances. A more specific  alternate chemical method 1700 must be used in order to obtain a confirmed analytical result.  1800 Gas chromato graphy / mass spectrometry (GC/MS) is the preferred 1900 confirmatory method.   Osmolality, urine     Status: None   Collection Time: 08/18/14  5:12 PM  Result Value Ref Range  Osmolality, Ur 537 300 - 900 mOsm/kg  Hemoglobin A1c     Status: None   Collection Time: 08/18/14  9:11 PM  Result Value Ref Range   Hgb A1c MFr Bld 4.8 4.0 - 6.0 %  Basic metabolic panel     Status: Abnormal   Collection Time: 08/19/14  5:25 AM  Result Value Ref Range   Sodium 127 (L) 135 - 145 mmol/L   Potassium 3.9 3.5 - 5.1 mmol/L   Chloride 97 (L) 101 - 111 mmol/L   CO2 26 22 - 32 mmol/L   Glucose, Bld 81 65 - 99 mg/dL   BUN 6 6 - 20 mg/dL   Creatinine, Ser 0.71 0.61 - 1.24 mg/dL   Calcium 9.7 8.9 - 10.3 mg/dL   GFR calc non Af Amer >60 >60 mL/min   GFR calc Af Amer >60 >60 mL/min    Comment: (NOTE) The eGFR has been calculated using the CKD EPI equation. This calculation has not been validated in all clinical situations. eGFR's persistently <60 mL/min signify possible Chronic Kidney Disease.    Anion gap 4 (L) 5 - 15    Observation Level/Precautions:  15 minute checks  Laboratory:  Plan to recheck sodium tomorrow morning. Does not need ongoing monitoring of his blood counts  Psychotherapy:  Group psychotherapy to promote appropriate social behavior   Medications:  Patient is on both haloperidol and olanzapine and takes Depakote. Tolerating medicines well. Arrived at by long empirical trial   Consultations:  Not required unless his sodium dipstick down much lower   Discharge Concerns:  Need to confirm with the group home that they are okay with him coming home and that his outpatient psychiatric treatment is in place   Estimated LOS: 3-5 days   Other:     Psychological Evaluations: No   Treatment Plan Summary: Daily contact with patient to assess and evaluate symptoms and  progress in treatment, Medication management and Plan Patient educated about rationale for admission. Also tried to talk with him about the dangers of excess water drinking.  Medical Decision Making:  Established Problem, Stable/Improving (1), Review of Psycho-Social Stressors (1), Review or order clinical lab tests (1), Review and summation of old records (2) and Review of Medication Regimen & Side Effects (2)  I certify that inpatient services furnished can reasonably be expected to improve the patient's condition.     6/3/20168:34 PM

## 2014-08-19 NOTE — Discharge Summary (Signed)
Jeff Long, is a 54 y.o. male  DOB [birth year]  MRN [id number].  Admission date:  08/18/2014  Admitting Physician  Katharina Caper, MD  Discharge Date:  08/19/2014   Primary MD  No PCP Per Patient  Recommendations for primary care physician for things to follow:  Going to [facility] for schizophrneia   Admission Diagnosis  Hyponatremia [E87.1] Aggression [F60.89]   Discharge Diagnosis  Hyponatremia [E87.1] Aggression [F60.89]    Principal Problem:   Schizophrenia Active Problems:   Agitation   Hyponatremia   Anemia   Vitamin D deficiency   Tobacco abuse      Past Medical History  Diagnosis Date  . Schizophrenia   . Crohn's disease     History reviewed. No pertinent past surgical history.     History of present illness and  Hospital Course:     Kindly see H&P for history of present illness and admission details, please review complete Labs, Consult reports and Test reports for all details in brief  HPI  from the history and physical done on the day of admission 54 yr old male with schizophrenia   ,admittted to medicine for hyponatremia of  123.   Hospital Course  Pt has chronic hyponatremia,sodium rpt improved to 127.I spoke to Dr.Clapcs,has chronic hyponatremia with baseline Sodium 126-127.he  Will go to [facility] down stairs today for treatment of his Schizophrenia with more recent paranoid and aggressive behavior, Discharge Condition: stable   Follow UP      Discharge Instructions  and  Discharge Medications        Medication List    TAKE these medications        benztropine 1 MG tablet  Commonly known as:  COGENTIN  Take 1 mg by mouth 3 (three) times daily.     cholecalciferol 1000 UNITS tablet  Commonly known as:  VITAMIN D  Take 1,000 Units by mouth daily.     divalproex 500 MG DR tablet   Commonly known as:  DEPAKOTE  Take 500 mg by mouth 2 (two) times daily.     ferrous sulfate 325 (65 FE) MG tablet  Take 325 mg by mouth daily.     OLANZapine 20 MG tablet  Commonly known as:  ZYPREXA  Take 30 mg by mouth at bedtime.          Diet and Activity recommendation: See Discharge Instructions above   Consults obtained -psych   Major procedures and Radiology Reports - PLEASE review detailed and final reports for all details, in brief -     No results found.  Micro Results    No results found for this or any previous visit (from the past 240 hour(s)).     Today   Subjective:   Castulo Scarpelli today ,denies any complaints.has sitter at bedside,  Blood pressure 102/65, pulse 66, temperature 97.8 F (36.6 C), temperature source Oral, resp. rate 18, height  (1.803 m), weight 64.955 kg (143 lb 3.2 oz), SpO2 97 %.   Intake/Output Summary (Last 24 hours) at 08/19/14 1015 Last data filed at 08/19/14 0852  Gross per 24 hour  Intake    480 ml  Output      0 ml  Net    480 ml    Exam Awake Alert, Oriented x 3, No new F.N deficits, Normal affect Will.AT,PERRAL Supple Neck,No JVD, No cervical lymphadenopathy appriciated.  Symmetrical Chest wall movement, Good air movement bilaterally, CTAB RRR,No Gallops,Rubs or new Murmurs, No Parasternal  Heave +ve B.Sounds, Abd Soft, Non tender, No organomegaly appriciated, No rebound -guarding or rigidity. No Cyanosis, Clubbing or edema, No new Rash or bruise  Data Review   CBC w Diff: Lab Results  Component Value Date   WBC 6.7 08/18/2014   WBC 4.4 07/09/2014   HGB 10.7* 08/18/2014   HGB 10.0* 07/09/2014   HCT 31.8* 08/18/2014   HCT 30.1* 07/09/2014   PLT 384 08/18/2014   PLT 281 07/09/2014   LYMPHOPCT 22 08/18/2014   LYMPHOPCT 27.2 02/05/2012   MONOPCT 7 08/18/2014   MONOPCT 12.9 02/05/2012   EOSPCT 2 08/18/2014   EOSPCT 2.6 02/05/2012   BASOPCT 1 08/18/2014   BASOPCT 0.9 02/05/2012    CMP: Lab  Results  Component Value Date   NA 127* 08/19/2014   NA 130* 07/09/2014   K 3.9 08/19/2014   K 4.0 07/09/2014   CL 97* 08/19/2014   CL 100* 07/09/2014   CO2 26 08/19/2014   CO2 25 07/09/2014   BUN 6 08/19/2014   BUN 5* 07/09/2014   CREATININE 0.71 08/19/2014   CREATININE 0.75 07/09/2014   PROT 7.6 08/18/2014   PROT 6.9 07/09/2014   ALBUMIN 3.4* 08/18/2014   ALBUMIN 2.9* 07/09/2014   BILITOT 0.6 08/18/2014   ALKPHOS 79 08/18/2014   ALKPHOS 70 07/09/2014   AST 16 08/18/2014   AST 11* 07/09/2014   ALT 9* 08/18/2014   ALT 8* 07/09/2014  .   Total Time in preparing paper work, data evaluation and todays exam - 35 minutes  Warren Lindahl M.D on 08/19/2014 at 10:15 AM

## 2014-08-20 ENCOUNTER — Encounter: Payer: Self-pay | Admitting: Psychiatry

## 2014-08-20 LAB — SODIUM: Sodium: 131 mmol/L — ABNORMAL LOW (ref 135–145)

## 2014-08-20 MED ORDER — NICOTINE 10 MG IN INHA: 1.0000 | RESPIRATORY_TRACT | Status: DC | PRN

## 2014-08-20 NOTE — Progress Notes (Signed)
Pt isolative to his room. Cooperative when approached. Compliant with hs medications. Denies si/hi or avh. Q 15 minute checks cont for safety.

## 2014-08-20 NOTE — Progress Notes (Signed)
Initial Nutrition Assessment  DOCUMENTATION CODES:     INTERVENTION:   (Meals and Snacks: Cater to patient preferences)  NUTRITION DIAGNOSIS:   (No nutrition concerns at this time)   GOAL:  Patient will meet greater than or equal to 90% of their needs  MONITOR:   (Energy intake, Electrolyte and renal Profile)  REASON FOR ASSESSMENT:  Malnutrition Screening Tool    ASSESSMENT:  Reason For Admission: Schizophrenia PMHx:  Past Medical History  Diagnosis Date  . Schizophrenia   . Crohn's disease   . Asthma     Typical Fluid/ Food Intake: 70-100% of meals noted per I/O Meal/ Snack Patterns: Unable to assess Supplements:None  Labs:  Electrolyte and Renal Profile:  Recent Labs Lab 08/18/14 1711 08/19/14 0525 08/20/14 0651  BUN 8 6  --   CREATININE 0.89 0.71  --   NA 123* 127* 131*  K 3.9 3.9  --    Protein Profile:   Recent Labs Lab 08/18/14 1711  ALBUMIN 3.4*   Meds: Vit D  Physical Findings: n/a Weight Changes: Reviewed previous medical recorded from Nov 2015- patient's weight was 132#. Current weight represents weight gain x 7 months.   Height:  Ht Readings from Last 1 Encounters:  08/19/14 5\' 11"  (1.803 m)    Weight:  Wt Readings from Last 1 Encounters:  08/19/14 140 lb (63.504 kg)    Ideal Body Weight:     Wt Readings from Last 10 Encounters:  08/19/14 140 lb (63.504 kg)    BMI:  Body mass index is 19.53 kg/(m^2).  Skin:  Reviewed, no issues  Diet Order:  Diet regular Room service appropriate?: Yes; Fluid consistency:: Thin  EDUCATION NEEDS:  No education needs identified at this time   Intake/Output Summary (Last 24 hours) at 08/20/14 1326 Last data filed at 08/20/14 1309  Gross per 24 hour  Intake    480 ml  Output      0 ml  Net    480 ml    Last BM:  6/1  Joeseph Amor, RDN Pager: [phone] Office: [phone]

## 2014-08-20 NOTE — Plan of Care (Signed)
Problem: Ineffective individual coping Goal: STG: Patient will remain free from self harm Outcome: Progressing No attempts to harm self or others.     

## 2014-08-20 NOTE — [facility] Group Notes (Signed)
[facility] Group Notes:  (Nursing/MHT/Case Management/Adjunct)  Date:  08/20/2014  Time:  10:53 AM  Type of Therapy:  Community Meeting   Participation Level:  Did Not Attend  Summary of Progress/Problems:  Jeff Long Jeff Long 08/20/2014, 10:53 AM

## 2014-08-20 NOTE — Progress Notes (Signed)
Pt has been pleasant and cooperative. Pt denies SI and A/V hallucinations. Pt has attended some unit activities. Pt's mood and affect has been depressed. Limited verbal interactions noted with peers and staff. Pt has been some what  seclusive to his room.
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
## 2014-08-21 ENCOUNTER — Encounter: Payer: Self-pay | Admitting: Psychiatry

## 2014-08-21 DIAGNOSIS — F203 Undifferentiated schizophrenia: Secondary | ICD-10-CM | POA: Diagnosis present

## 2014-08-21 LAB — LIPID PANEL
Cholesterol: 202 mg/dL — ABNORMAL HIGH (ref 0–200)
HDL: 53 mg/dL (ref >40)
LDL CALC: 133 mg/dL — AB (ref 0–99)
TRIGLYCERIDES: 79 mg/dL (ref <150)
Total CHOL/HDL Ratio: 3.8 RATIO
VLDL: 16 mg/dL (ref 0–40)

## 2014-08-21 LAB — SODIUM: Sodium: 130 mmol/L — ABNORMAL LOW (ref 135–145)
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
## 2014-08-21 NOTE — [facility] Group Notes (Signed)
[facility] LCSW Group Therapy  08/21/2014 3:48 PM  Type of Therapy:  Group Therapy  Participation Level:  Did Not Attend  Participation Quality:  n/a  Affect:  n/a  Cognitive:  n/a  Insight:  n/a  Engagement in Therapy:  n/a  Modes of Intervention:  n/a  Summary of Progress/Problems:  Beryl Meager T 08/21/2014, 3:48 PM

## 2014-08-21 NOTE — Progress Notes (Signed)
Pt has been pleasant and cooperative. Pt has attended some unit activities. Pt denies SI and A/V hallucinations Pt's mood and affect appears less depressed. Pt continues to be seclusive to his room .Will continue to observe and redirect as needed.Marland Kitchen

## 2014-08-21 NOTE — Progress Notes (Signed)
Patient denies feeling depressed or anxious; denies any SI and contracts for safety; denies having any hallucinations; has been calm and cooperative throughout the shift; no voiced complaints; continue to monitor. 

## 2014-08-21 NOTE — Plan of Care (Signed)
Problem: Ineffective individual coping Goal: STG: Patient will remain free from self harm Outcome: Progressing Patient denies having any thoughts of suicide and contracts for safety.

## 2014-08-21 NOTE — Plan of Care (Signed)
Problem: Alteration in mood Goal: LTG-Patient reports reduction in suicidal thoughts (Patient reports reduction in suicidal thoughts and is able to verbalize a safety plan for whenever patient is feeling suicidal)  Outcome: Progressing Patient denies having any thoughts of suicide at present.     

## 2014-08-22 ENCOUNTER — Encounter: Payer: Self-pay | Admitting: Psychiatry

## 2014-08-22 DIAGNOSIS — F203 Undifferentiated schizophrenia: Secondary | ICD-10-CM

## 2014-08-22 LAB — SODIUM: Sodium: 132 mmol/L — ABNORMAL LOW (ref 135–145)

## 2014-08-22 MED ORDER — BENZTROPINE MESYLATE 1 MG PO TABS
0.5000 mg | ORAL_TABLET | Freq: Two times a day (BID) | ORAL | Status: DC
  Administered 2014-08-22 – 2014-08-23 (×2): 0.5 mg via ORAL
  Filled 2014-08-22 (×2): qty 1

## 2014-08-22 NOTE — [facility] Group Notes (Signed)
[facility] Group Notes:  (Nursing/MHT/Case Management/Adjunct)  Date:  08/22/2014  Time:  4:30 AM  Type of Therapy:  Group Therapy  Participation Level:  Did Not Attend  Participation Quality:  n/a  Affect:  n/a  Cognitive:  n/a  Insight:  None  Engagement in Group:   n/a  Modes of Intervention:  n/a  Summary of Progress/Problems:  Veva Holes 08/22/2014, 4:30 AM

## 2014-08-22 NOTE — [facility] Group Notes (Signed)
[facility] LCSW Aftercare Discharge Planning Group Note  08/22/2014 10:34 AM  Participation Quality:  Did not attend  Affect:  na  Cognitive:  na  Insight:  na  Engagement in Group:  na  Modes of Intervention:  na  Summary of Progress/Problems:Patient did not attend  Jeff Long M 08/22/2014, 10:34 AM

## 2014-08-22 NOTE — Progress Notes (Signed)
Patient denies feeling depressed or anxious; denies any SI and contracts for safety; denies any hallucinations; no psychotic episodes observed or reported; has been calm and cooperative throughout the shift; no voiced complaints; continue to monitor.

## 2014-08-22 NOTE — Progress Notes (Signed)
Recreation Therapy Notes  Date: 06.06.16 Time: 3:00 pm Location: Craft Room  Group Topic: Wellness  Goal Area(s) Addresses:  Patient will identify at least one item per dimension of health. Patient will examine areas they are deficient in.  Behavioral Response: Did not attend   Intervention: 6 Dimensions of Health  Activity: Patients were given a worksheet with the 6 dimensions of health on it. Patients were given a worksheet with the 6 dimensions of health on it and instructed to list at least 1 item under each category.   Education: LRT educated patients on each dimension of health and how they can increase each dimension.  Education Outcome: Patient did not attend group.  Clinical Observations/Feedback: Patient did not attend group.  Starr Urias M, LRT/CTRS 08/22/2014 4:13 PM 

## 2014-08-22 NOTE — Progress Notes (Signed)
Recreation Therapy Notes  At approximately 2:23 pm, LRT attempted assessment. Patient refused stated he did not want to participate.  Jacquelynn Cree, LRT/CTRS 08/22/2014 2:30 PM

## 2014-08-22 NOTE — Progress Notes (Signed)
He stayed in bed most of the shift.reluctant to talk to staff.Did not attend groups.Denies depression,suicidal or homicidal ideation or hallucination.Compliant with meds.Appetite good.

## 2014-08-22 NOTE — [facility] Group Notes (Signed)
[facility] LCSW Group Therapy  08/22/2014 5:55 PM  Type of Therapy:  Group Therapy  Participation Level:  Did Not Attend  Participation Quality:  na  Affect:  na  Cognitive:  na  Insight:  na  Engagement in Therapy:  na  Modes of Intervention:  na  Summary of Progress/Problems:  Cheron Schaumann 08/22/2014, 5:55 PM

## 2014-08-22 NOTE — [facility] Group Notes (Signed)
[facility] Group Notes:  (Nursing/MHT/Case Management/Adjunct)  Date:  08/22/2014  Time:  12:27 PM  Type of Therapy:  Group Therapy  Participation Level:  Did Not Attend   Marquette Old 08/22/2014, 12:27 PM
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
## 2014-08-22 NOTE — Plan of Care (Signed)
Problem: Ineffective individual coping Goal: STG: Patient will remain free from self harm Outcome: Progressing Patient denies any SI, and contracts for safety.     

## 2014-08-23 LAB — TSH: TSH: 1.892 u[IU]/mL (ref 0.350–4.500)

## 2014-08-23 LAB — BASIC METABOLIC PANEL
Anion gap: 8 (ref 5–15)
BUN: 8 mg/dL (ref 6–20)
CHLORIDE: 94 mmol/L — AB (ref 101–111)
CO2: 28 mmol/L (ref 22–32)
CREATININE: 0.92 mg/dL (ref 0.61–1.24)
Calcium: 11 mg/dL — ABNORMAL HIGH (ref 8.9–10.3)
GFR calc non Af Amer: >60 mL/min (ref >60)
GLUCOSE: 80 mg/dL (ref 65–99)
Potassium: 4.7 mmol/L (ref 3.5–5.1)
SODIUM: 130 mmol/L — AB (ref 135–145)

## 2014-08-23 LAB — VALPROIC ACID LEVEL: Valproic Acid Lvl: 72 ug/mL (ref 50.0–100.0)

## 2014-08-23 MED ORDER — BENZTROPINE MESYLATE 0.5 MG PO TABS: 0.5000 mg | ORAL_TABLET | Freq: Two times a day (BID) | ORAL | Status: DC

## 2014-08-23 NOTE — [facility] Suicide Risk Assessment (Signed)
[facility] Discharge Suicide Risk Assessment   Demographic Factors:  Male  Total Time spent with patient: 30 minutes  Musculoskeletal: Strength & Muscle Tone: within normal limits Gait & Station: normal Patient leans: N/A  Psychiatric Specialty Exam: Physical Exam  Review of Systems  Constitutional: Negative.   HENT: Negative.   Eyes: Negative.   Respiratory: Negative.   Cardiovascular: Negative.   Gastrointestinal: Negative.   Musculoskeletal: Negative.   Skin: Negative.   Neurological: Negative.   Endo/Heme/Allergies: Negative.   Psychiatric/Behavioral: Negative.     Blood pressure 101/63, pulse 108, temperature 98.3 F (36.8 C), temperature source Oral, resp. rate 20, height 5\' 11"  (1.803 m), weight 63.504 kg (140 lb), SpO2 93 %.Body mass index is 19.53 kg/(m^2).                                                          Has this patient used any form of tobacco in the last 30 days? (Cigarettes, Smokeless Tobacco, Cigars, and/or Pipes) Yes, A prescription for an FDA-approved tobacco cessation medication was offered at discharge and the patient refused  Mental Status Per Nursing Assessment::   On Admission:  NA  Current Mental Status by Physician: Denies suicidal ideation, denies homicidal ideation, denies auditory or visual hallucinations. No evidence of agitation or aggression  Loss Factors: NA  Historical Factors: NA  Risk Reduction Factors:   Living with another person, especially a relative and Positive social support  Continued Clinical Symptoms:  Schizophrenia:   Paranoid or undifferentiated type  Cognitive Features That Contribute To Risk:  Closed-mindedness    Suicide Risk:  Minimal: No identifiable suicidal ideation.  Patients presenting with no risk factors but with morbid ruminations; may be classified as minimal risk based on the severity of the depressive symptoms  Principal Problem: Undifferentiated schizophrenia Discharge  Diagnoses:  Patient Active Problem List   Diagnosis Date Noted  . Undifferentiated schizophrenia [F20.3]   . Hyponatremia [E87.1] 08/18/2014  . Anemia [D64.9] 08/18/2014  . Vitamin D deficiency [E55.9] 08/18/2014  . Tobacco abuse [Z72.0] 08/18/2014      Plan Of Care/Follow-up recommendations:  Other:  Consider discontinuing benztropine as this medication could be contributing to polydipsia.  Is patient on multiple antipsychotic therapies at discharge:  No   Has Patient had three or more failed trials of antipsychotic monotherapy by history:  No  Recommended Plan for Multiple Antipsychotic Therapies: NA    Jimmy Footman 08/23/2014, 9:06 AM

## 2014-08-23 NOTE — [facility] Group Notes (Signed)
[facility] Group Notes:  (Nursing/MHT/Case Management/Adjunct)  Date:  08/23/2014  Time:  2:39 PM  Type of Therapy:  Group Therapy  Participation Level:  Did Not Attend   Jeff Long 08/23/2014, 2:39 PM 

## 2014-08-23 NOTE — Progress Notes (Signed)
AVS H&P Discharge Summary faxed to [facility] for hospital follow-up °

## 2014-08-23 NOTE — Progress Notes (Signed)
Patient denies SI/HI, denies A/V hallucinations. Patient verbalizes understanding of discharge instructions, follow up care and prescriptions. Patient given all belongings from  locker. Patient escorted out by staff, transported by group home staff. 

## 2014-08-23 NOTE — Progress Notes (Signed)
  [facility] Adult Case Management Discharge Plan :  Will you be returning to the same living situation after discharge:  Yes,    At discharge, do you have transportation home?: Yes,  Group Home Do you have the ability to pay for your medications: Yes,     Release of information consent forms completed and in the chart;  Patient's signature needed at discharge.  Patient to Follow up at: Follow-up Information    Follow up with [facility]. Go in 1 week.   Why:  Hospital Follow up, Outpatient Medication Management, Therapy, Walk-ins Monday-Friday between 9am-4pm., Please take your photo ID and Insurance card   Contact information:   [address] [place] [address] [phone]; fax-[phone]      Patient denies SI/HI: Yes,       Safety Planning and Suicide Prevention discussed: Yes,        Has patient been referred to the Quitline?: Patient refused referral  Glennon Mac, LCSW 08/23/2014, 2:45 PM

## 2014-08-23 NOTE — Progress Notes (Signed)
Recreation Therapy Notes  Date: 06.07.16 Time: 3:00 pm Location: Craft Room  Group Topic: Self-expression  Goal Area(s) Addresses:  Patient will identify one color per emotion listed on wheel. Patient will verbalize benefit of using art as a means of self-expression. Patient will verbalize one emotion experienced during session. Patient will be educated on other forms of self-expression.  Behavioral Response: Did not attend  Intervention: Emotion Wheel  Activity: Patients were given a worksheet with 7 emotions and instructed to pick a color for each emotion.   Education: LRT educated patients on different forms on self-expression.  Education Outcome: Patient did not attend group.  Clinical Observations/Feedback: Patient did not attend group.  Jacquelynn Cree, LRT/CTRS 08/23/2014 4:22 PM

## 2014-08-23 NOTE — Discharge Summary (Signed)
Physician Discharge Summary Note  Patient:  Jeff Long is an 54 y.o., male MRN:  [id number] DOB:  06-24-1960 Patient phone:  [phone] (home)  Patient address:   [address] [address],  Total Time spent with patient: 30 minutes  Date of Admission:  08/19/2014 Date of Discharge: 08/23/2014  Reason for Admission:  Aggression and altercation a group home  Principal Problem: Undifferentiated schizophrenia Discharge Diagnoses: Patient Active Problem List   Diagnosis Date Noted  . Undifferentiated schizophrenia [F20.3]   . Hyponatremia [E87.1] 08/18/2014  . Anemia [D64.9] 08/18/2014  . Vitamin D deficiency [E55.9] 08/18/2014  . Tobacco abuse [Z72.0] 08/18/2014    Musculoskeletal: Strength & Muscle Tone: within normal limits Gait & Station: normal Patient leans: N/A  Psychiatric Specialty Exam: Physical Exam  Review of Systems  Constitutional: Negative.   HENT: Negative.   Eyes: Negative.   Respiratory: Negative.   Cardiovascular: Negative.   Gastrointestinal: Negative.   Genitourinary: Negative.   Musculoskeletal: Negative.   Skin: Negative.   Neurological: Negative.   Endo/Heme/Allergies: Negative.   Psychiatric/Behavioral: Negative.     Blood pressure 101/63, pulse 108, temperature 98.3 F (36.8 C), temperature source Oral, resp. rate 20, height $RemoveBe'5\' 11"'mJsFoPoZZ$  (1.803 m), weight 63.504 kg (140 lb), SpO2 93 %.Body mass index is 19.53 kg/(m^2).  General Appearance: Fairly Groomed  Engineer, water::  Good  Speech:  Normal Rate  Volume:  Normal  Mood:  Euthymic  Affect:  Constricted  Thought Process:  Concrete  Orientation:  Full (Time, Place, and Person)  Thought Content:  Hallucinations: None  Suicidal Thoughts:  No  Homicidal Thoughts:  No  Memory:  Immediate;   Fair Recent;   Fair Remote;   Fair  Judgement:  Fair  Insight:  Shallow  Psychomotor Activity:  Normal  Concentration:  Fair  Recall:  NA  Fund of Knowledge:Good  Language: Good   Akathisia:  No  Handed:    AIMS (if indicated):     Assets:  Financial Resources/Insurance Housing  ADL's:  Intact  Cognition: WNL  Sleep:  Number of Hours: 7      Has this patient used any form of tobacco in the last 30 days? (Cigarettes, Smokeless Tobacco, Cigars, and/or Pipes) Yes, A prescription for an FDA-approved tobacco cessation medication was offered at discharge and the patient refused  History of Present Illness:: Patient is a 54 year old man with a history of schizophrenia who presented to the emergency room from his group home yesterday very agitated hostile delusional and bizarre. This is a pretty typical presentation for him. It wasn't clear that he had been off his medicine or had any real acute distress. He did appear to be too agitated to go home. He also had a sodium of 123. The patient is chronically hyponatremic but that was a little little lower than his usual presentation. He was admitted to the medicine unit overnight but then had a sodium this morning of 127. He's been transferred down to psychiatry. She is now reporting that he feels fine. Denies hallucinations denies suicidal ideation and is not presenting with a kind of paranoid bizarre thinking that he had yesterday. He's been compliant with medication.  Past psychiatric history: This gentleman has a long history of schizophrenia and has had several prior admissions. This is actually a pretty typical presentation that he will come into the hospital paranoid and agitated making all kinds of bizarre statements but then calm down pretty quickly. He doesn't have a history of substance abuse.  Medically he is suffering from chronic hyponatremia which may be due largely to polydipsia and also has a chronic anemia probably from poor diet. He is stable on his current medicine regimen.  Patient resides in a group home. Minimal family involvement.  No substance abuse history. No known acute trauma or stress Elements: Quality:  Delusional psychotic bizarre thinking and agitated behavior. Severity: Fairly severe although he doesn't actually get physically violent. It is pretty dramatic however when he is being psychotic. Timing: Worse over just the past couple days but now improving. Duration: Usually last for just a day or 2 before clears up. Context: Unknown. Possibly there was some kind of insult or conflict at his group home. Associated Signs/Symptoms: Depression Symptoms: hypersomnia, psychomotor retardation, (Hypo) Manic Symptoms: Flight of Ideas, Irritable Mood, Anxiety Symptoms: Specific anxiety mostly about psychotic paranoid subjects Psychotic Symptoms: Ideas of Reference, Paranoia, PTSD Symptoms: Negative Past Medical History:  Past Medical History  Diagnosis Date  . Schizophrenia   . Crohn's disease   . Asthma     Past Surgical History  Procedure Laterality Date  . No past surgeries     Family History: History reviewed. No pertinent family history. Social History:  History  Alcohol Use No     History  Drug Use No    History   Social History  . Marital Status: Single    Spouse Name: N/A  . Number of Children: N/A  . Years of Education: N/A   Social History Main Topics  . Smoking status: Current Every Day Smoker -- 0.50 packs/day  . Smokeless tobacco: Not on file  . Alcohol Use: No  . Drug Use: No  . Sexual Activity: Not on file   Other Topics Concern  . None   Social History Narrative   The patient is currently a resident at a group home. He has never been married and has no children. His sister and mother live in [place]. He has a 10th grade education and is on Disability since early 74s.    Hospital Course:   Jeff Long is a 54 year old American male with history of schizophrenia and psychogenic polydipsia who was admitted from the medicine service after initially presenting with agitation to the emergency room in the context of  hyponatremia.  Schizophrenia: No agitation or aggression on the unit. The patient was restarted on Depakote 500 mg by mouth twice a day for mood stabilization and valproic acid level was 75 at the time of admission. No elevation of liver enzymes. He was also restarted on Zyprexa 30 mg by mouth nightly for mood stabilization and psychosis. The patient also has been maintained on Cogentin 1 mg by mouth 3 times a day. It is unclear as to why the patient will require such a high dose of benztropine therefore I will decrease this dose 0.5 mg twice a day.  Metabolic syndrome:Total cholesterol was 202. Hemoglobin A1c was 4.8.  Abnormal vital signs: Since admission patient has been hypotensive. Today his blood pressure is within the normal limits but heart rate is 106. Will continue to monitor closely.  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 The hyponatremia is chronic. Will continue fluid restriction of 1.2 L a day.. The patient will be continued on regular diet and will be encouraged to add extra salt to meals.  Tobacco use disorder, severe: Will offer Nicotrol Inhaler. He was educated about negative consequences of tobacco on health  Anemia: We'll continue on iron supplement  325 mg by mouth daily. Hemoglobin was 10.7 on June 2.  Disposition: The patient plans to return to the group home. Take medication management follow-up appointment as well as individual  Disposition: Patient will return to his group home today  On the day of the discharge the patient denied SI, HI or auditory or visual hallucinations. He was calm and cooperative. There was no evidence of agitation, paranoia or delusional thinking. He reported tolerating well his medications. He denied having any side effects. He denied having any physical complaints. This hospitalization was uneventful. Patient did not participate in programming. He stated to himself in his room most of the time. There were no  episodes of agitation or aggression during his stay in the unit. There was no need for PRNs, seclusion, restraint or forced medications.   Consults:  None  Significant Diagnostic Studies:  None  Discharge Vitals:   Blood pressure 101/63, pulse 108, temperature 98.3 F (36.8 C), temperature source Oral, resp. rate 20, height $RemoveBe'5\' 11"'rcQkTdnPl$  (1.803 m), weight 63.504 kg (140 lb), SpO2 93 %. Body mass index is 19.53 kg/(m^2).   Lab Results:   Results for KOBYN, KRAY (MRN [id number]) as of 08/23/2014 11:29  Ref. Range 08/18/2014 17:11 08/18/2014 17:12 08/18/2014 21:11 08/19/2014 05:25 08/20/2014 06:51 08/21/2014 06:31 08/22/2014 06:30 08/23/2014 07:13  Sodium Latest Ref Range: 135-145 mmol/L 123 (L)   127 (L) 131 (L) 130 (L) 132 (L) 130 (L)  Potassium Latest Ref Range: 3.5-5.1 mmol/L 3.9   3.9    4.7  Chloride Latest Ref Range: 101-111 mmol/L 91 (L)   97 (L)    94 (L)  CO2 Latest Ref Range: 22-32 mmol/L $RemoveBefo'24   26    28  'VKpTBqCHsAv$ BUN Latest Ref Range: 6-20 mg/dL $RemoveBe'8   6    8  'ZpXJAJPDi$ Creatinine Latest Ref Range: 0.61-1.24 mg/dL 0.89   0.71    0.92  Calcium Latest Ref Range: 8.9-10.3 mg/dL 10.2   9.7    11.0 (H)  EGFR (Non-African Amer.) Latest Ref Range: >60 mL/min >60   >60    >60  EGFR (African American) Latest Ref Range: >60 mL/min >60   >60    >60  Glucose Latest Ref Range: 65-99 mg/dL 116 (H)   81    80  Anion gap Latest Ref Range: 5-$RemoveBefo'15  8   4 'fWFMLakHrfo$ (L)    8  Alkaline Phosphatase Latest Ref Range: 38-126 U/L 79         Albumin Latest Ref Range: 3.5-5.0 g/dL 3.4 (L)         AST Latest Ref Range: 15-41 U/L 16         ALT Latest Ref Range: 17-63 U/L 9 (L)         Total Protein Latest Ref Range: 6.5-8.1 g/dL 7.6         Total Bilirubin Latest Ref Range: 0.3-1.2 mg/dL 0.6         Cholesterol Latest Ref Range: 0-200 mg/dL      202 (H)    Triglycerides Latest Ref Range: <150 mg/dL      79    HDL Cholesterol Latest Ref Range: >40 mg/dL      53    LDL (calc) Latest Ref Range: 0-99 mg/dL      133 (H)    VLDL Latest Ref Range: 0-40 mg/dL       16    Total CHOL/HDL Ratio Latest Units: RATIO      3.8    WBC Latest  Ref Range: 3.8-10.6 K/uL 6.7         RBC Latest Ref Range: 4.40-5.90 MIL/uL 3.24 (L)         Hemoglobin Latest Ref Range: 13.0-18.0 g/dL 83.0 (L)         HCT Latest Ref Range: 40.0-52.0 % 31.8 (L)         MCV Latest Ref Range: 80.0-100.0 fL 98.0         MCH Latest Ref Range: 26.0-34.0 pg 32.9         MCHC Latest Ref Range: 32.0-36.0 g/dL 73.5         RDW Latest Ref Range: 11.5-14.5 % 13.4         Platelets Latest Ref Range: 150-440 K/uL 384         Neutrophils Latest Units: % 68         Lymphocytes Latest Units: % 22         Monocytes Relative Latest Units: % 7         Eosinophil Latest Units: % 2         Basophil Latest Units: % 1         NEUT# Latest Ref Range: 1.4-6.5 K/uL 4.5         Lymphocyte # Latest Ref Range: 1.0-3.6 K/uL 1.5         Monocyte # Latest Ref Range: 0.2-1.0 K/uL 0.5         Eosinophils Absolute Latest Ref Range: 0-0.7 K/uL 0.2         Basophils Absolute Latest Ref Range: 0-0.1 K/uL 0.0         Valproic Acid Lvl Latest Ref Range: 50.0-100.0 ug/mL 75       72  Hemoglobin A1C Latest Ref Range: 4.0-6.0 %   4.8       TSH Latest Ref Range: 0.350-4.500 uIU/mL        1.892  Appearance Latest Ref Range: CLEAR   CLEAR (A)        Bacteria, UA Latest Ref Range: NONE SEEN   NONE SEEN        Bilirubin Urine Latest Ref Range: NEGATIVE   NEGATIVE        Color, Urine Latest Ref Range: YELLOW   YELLOW (A)        Glucose Latest Ref Range: NEGATIVE mg/dL  NEGATIVE        Hgb urine dipstick Latest Ref Range: NEGATIVE   NEGATIVE        Ketones, ur Latest Ref Range: NEGATIVE mg/dL  TRACE (A)        Leukocytes, UA Latest Ref Range: NEGATIVE   NEGATIVE        Mucous Unknown  PRESENT        Nitrite Latest Ref Range: NEGATIVE   NEGATIVE        pH Latest Ref Range: 5.0-8.0   5.0        Protein Latest Ref Range: NEGATIVE mg/dL  430 (A)        RBC / HPF Latest Ref Range: 0-5 RBC/hpf  0-5        Specific Gravity, Urine  Latest Ref Range: 1.005-1.030   1.019        Squamous Epithelial / LPF Latest Ref Range: NONE SEEN   NONE SEEN        WBC, UA Latest Ref Range: 0-5 WBC/hpf  0-5        Osmolality, Ur Latest Ref Range: 300-900 mOsm/kg  537  Alcohol, Ethyl (B) Latest Ref Range: <5 mg/dL <5         Amphetamines, Ur Screen Latest Ref Range: NONE DETECTED   NONE DETECTED        Barbiturates, Ur Screen Latest Ref Range: NONE DETECTED   NONE DETECTED        Benzodiazepine, Ur Scrn Latest Ref Range: NONE DETECTED   NONE DETECTED        Cocaine Metabolite,Ur [place] Latest Ref Range: NONE DETECTED   NONE DETECTED        Methadone Scn, Ur Latest Ref Range: NONE DETECTED   NONE DETECTED        MDMA (Ecstasy)Ur Screen Latest Ref Range: NONE DETECTED   NONE DETECTED        Cannabinoid 50 Ng, Ur [place] Latest Ref Range: NONE DETECTED   NONE DETECTED        Opiate, Ur Screen Latest Ref Range: NONE DETECTED   NONE DETECTED        Phencyclidine (PCP) Ur S Latest Ref Range: NONE DETECTED   NONE DETECTED        Tricyclic, Ur Screen Latest Ref Range: NONE DETECTED   NONE DETECTED         Physical Findings: AIMS: Facial and Oral Movements Muscles of Facial Expression: None, normal, ,  ,  ,    CIWA:    COWS:      See Psychiatric Specialty Exam and Suicide Risk Assessment completed by Attending Physician prior to discharge.  Discharge destination:  Other:  Group home  Is patient on multiple antipsychotic therapies at discharge:  No   Has Patient had three or more failed trials of antipsychotic monotherapy by history:  No    Recommended Plan for Multiple Antipsychotic Therapies: NA  Discharge Instructions    Diet general    Complete by:  As directed             Medication List    TAKE these medications      Indication   benztropine 0.5 MG tablet  Commonly known as:  COGENTIN  Take 1 tablet (0.5 mg total) by mouth 2 (two) times daily.  Notes to Patient:  EPS      cholecalciferol 1000 UNITS tablet  Commonly  known as:  VITAMIN D  Take 1,000 Units by mouth daily.  Notes to Patient:  Vitamin D deficiency      divalproex 500 MG DR tablet  Commonly known as:  DEPAKOTE  Take 500 mg by mouth 2 (two) times daily.  Notes to Patient:  Mood instability      ferrous sulfate 325 (65 FE) MG tablet  Take 325 mg by mouth daily.  Notes to Patient:  Anemia      OLANZapine 20 MG tablet  Commonly known as:  ZYPREXA  Take 30 mg by mouth at bedtime.  Notes to Patient:  Psychosis          Follow-up recommendations:  Other:  Continue fluid restriction of 1.2 L a day. Patient was advised also to add extra salt to his meals as he suffers from chronic hyponatremia  Comments:  None   Total Discharge Time: 30 minutes  Signed: Hildred Priest 08/23/2014, 11:28 AM

## 2014-08-23 NOTE — [facility] Suicide Risk Assessment (Signed)
[facility] INPATIENT:  Family/Significant Other Suicide Prevention Education  Suicide Prevention Education:  Education Completed; Group Home staff, Lance Bosch,  (name of family member/significant other) has been identified by the patient as the family member/significant other with whom the patient will be residing, and identified as the person(s) who will aid the patient in the event of a mental health crisis (suicidal ideations/suicide attempt).  With written consent from the patient, the family member/significant other has been provided the following suicide prevention education, prior to the and/or following the discharge of the patient.  The suicide prevention education provided includes the following:  Suicide risk factors  Suicide prevention and interventions  National Suicide Hotline telephone number  [facility] assessment telephone number  [facility] Emergency Assistance 911  [facility] and/or Residential Mobile Crisis Unit telephone number  Request made of family/significant other to:  Remove weapons (e.g., guns, rifles, knives), all items previously/currently identified as safety concern.    Remove drugs/medications (over-the-counter, prescriptions, illicit drugs), all items previously/currently identified as a safety concern.  The family member/significant other verbalizes understanding of the suicide prevention education information provided.  The family member/significant other agrees to remove the items of safety concern listed above.  Glennon Mac, LCSW 08/23/2014, 2:44 PM

## 2014-08-23 NOTE — Progress Notes (Signed)
Alert and oriented x 2-3, pt appears aprehensive, sad and flat, very guarded, not attending unit group meeting and interacting with peers, 15 minutes checks maintained, will continue to closely monitor.

## 2014-08-23 NOTE — Plan of Care (Signed)
Problem: Alteration in mood Goal: LTG-Patient reports reduction in suicidal thoughts (Patient reports reduction in suicidal thoughts and is able to verbalize a safety plan for whenever patient is feeling suicidal)  Outcome: Progressing Patient denies SI/HI, contracts for safety.         

## 2014-10-14 ENCOUNTER — Emergency Department (HOSPITAL_COMMUNITY)
Admission: EM | Admit: 2014-10-14 | Discharge: 2014-10-16 | Disposition: A | Discharge status: Home / Self Care | Payer: Medicaid Other | Attending: Emergency Medicine | Admitting: Emergency Medicine

## 2014-10-14 ENCOUNTER — Encounter (HOSPITAL_COMMUNITY): Payer: Self-pay

## 2014-10-14 DIAGNOSIS — Z720 Tobacco use: Secondary | ICD-10-CM | POA: Insufficient documentation

## 2014-10-14 DIAGNOSIS — J45909 Unspecified asthma, uncomplicated: Secondary | ICD-10-CM | POA: Diagnosis not present

## 2014-10-14 DIAGNOSIS — F29 Unspecified psychosis not due to a substance or known physiological condition: Secondary | ICD-10-CM | POA: Insufficient documentation

## 2014-10-14 DIAGNOSIS — F209 Schizophrenia, unspecified: Secondary | ICD-10-CM | POA: Diagnosis not present

## 2014-10-14 DIAGNOSIS — F203 Undifferentiated schizophrenia: Secondary | ICD-10-CM | POA: Diagnosis present

## 2014-10-14 DIAGNOSIS — Z79899 Other long term (current) drug therapy: Secondary | ICD-10-CM | POA: Insufficient documentation

## 2014-10-14 DIAGNOSIS — F258 Other schizoaffective disorders: Secondary | ICD-10-CM | POA: Diagnosis not present

## 2014-10-14 DIAGNOSIS — F250 Schizoaffective disorder, bipolar type: Secondary | ICD-10-CM

## 2014-10-14 LAB — CBC
HEMATOCRIT: 34.2 % — AB (ref 39.0–52.0)
HEMOGLOBIN: 11.3 g/dL — AB (ref 13.0–17.0)
MCH: 32.8 pg (ref 26.0–34.0)
MCHC: 33 g/dL (ref 30.0–36.0)
MCV: 99.1 fL (ref 78.0–100.0)
Platelets: 353 10*3/uL (ref 150–400)
RBC: 3.45 MIL/uL — AB (ref 4.22–5.81)
RDW: 12.9 % (ref 11.5–15.5)
WBC: 5 10*3/uL (ref 4.0–10.5)

## 2014-10-14 LAB — RAPID URINE DRUG SCREEN, HOSP PERFORMED
Amphetamines: NOT DETECTED
Barbiturates: NOT DETECTED
Benzodiazepines: NOT DETECTED
COCAINE: NOT DETECTED
Opiates: NOT DETECTED
TETRAHYDROCANNABINOL: NOT DETECTED

## 2014-10-14 LAB — COMPREHENSIVE METABOLIC PANEL
ALBUMIN: 3.6 g/dL (ref 3.5–5.0)
ALT: 7 U/L — ABNORMAL LOW (ref 17–63)
AST: 16 U/L (ref 15–41)
Alkaline Phosphatase: 74 U/L (ref 38–126)
Anion gap: 7 (ref 5–15)
BUN: 9 mg/dL (ref 6–20)
CO2: 26 mmol/L (ref 22–32)
CREATININE: 0.96 mg/dL (ref 0.61–1.24)
Calcium: 10.7 mg/dL — ABNORMAL HIGH (ref 8.9–10.3)
Chloride: 102 mmol/L (ref 101–111)
GFR calc Af Amer: >60 mL/min (ref >60)
GFR calc non Af Amer: >60 mL/min (ref >60)
Glucose, Bld: 94 mg/dL (ref 65–99)
Potassium: 3.7 mmol/L (ref 3.5–5.1)
SODIUM: 135 mmol/L (ref 135–145)
Total Bilirubin: 0.4 mg/dL (ref 0.3–1.2)
Total Protein: 8.2 g/dL — ABNORMAL HIGH (ref 6.5–8.1)

## 2014-10-14 LAB — SALICYLATE LEVEL

## 2014-10-14 LAB — VALPROIC ACID LEVEL: VALPROIC ACID LVL: 29 ug/mL — AB (ref 50.0–100.0)

## 2014-10-14 LAB — ETHANOL: Alcohol, Ethyl (B): <5 mg/dL (ref <5)

## 2014-10-14 LAB — ACETAMINOPHEN LEVEL: Acetaminophen (Tylenol), Serum: <10 ug/mL — ABNORMAL LOW (ref 10–30)

## 2014-10-14 MED ORDER — BENZTROPINE MESYLATE 1 MG PO TABS
0.5000 mg | ORAL_TABLET | Freq: Two times a day (BID) | ORAL | Status: DC
  Administered 2014-10-14 – 2014-10-16 (×4): 0.5 mg via ORAL
  Filled 2014-10-14 (×4): qty 1

## 2014-10-14 MED ORDER — DIVALPROEX SODIUM 500 MG PO DR TAB
500.0000 mg | DELAYED_RELEASE_TABLET | Freq: Two times a day (BID) | ORAL | Status: DC
  Administered 2014-10-14 – 2014-10-16 (×4): 500 mg via ORAL
  Filled 2014-10-14 (×4): qty 1

## 2014-10-14 MED ORDER — OLANZAPINE 10 MG PO TABS
30.0000 mg | ORAL_TABLET | Freq: Every day | ORAL | Status: DC
  Administered 2014-10-14 – 2014-10-15 (×2): 30 mg via ORAL
  Filled 2014-10-14 (×2): qty 3

## 2014-10-14 MED ORDER — FERROUS SULFATE 325 (65 FE) MG PO TABS
325.0000 mg | ORAL_TABLET | Freq: Every day | ORAL | Status: DC
  Administered 2014-10-15 – 2014-10-16 (×2): 325 mg via ORAL
  Filled 2014-10-14 (×2): qty 1

## 2014-10-14 NOTE — Progress Notes (Signed)
Patient referred to [facility].  Also, referral faxed to hospitals with beds open/d/c in am: [facility] - per Annice Pih, beds open, fax it. [facility] - per Selena Batten, fax her over, pt's CIWA provided to [employer]. [facility] - per Eunice Blase, couple beds open, fax it. [facility] - per [place], fax it, beds open. [facility] - per [place], fax it. [facility] - per [facility]leg[facility] Messier, beds open, fax it. [facility] - per intake, fax for waitlist. [facility] - per intake, fax it. [facility] - per intake, fax referral for review.  At capacity: [facility] - doesn't take adult medicaid  CSW will continue to seek placement.  Melbourne Abts, LCSWA Disposition staff 10/14/2014 10:55 PM
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
## 2014-10-14 NOTE — BH Assessment (Signed)
Assessment completed. Consulted Hulan Fess, NP who agrees that pt meets inpatient criteria. TTS will contact other facilities for placement. Dr. Fredderick Phenix has been informed of the recommendation.
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
## 2014-10-15 DIAGNOSIS — F258 Other schizoaffective disorders: Secondary | ICD-10-CM

## 2014-10-15 DIAGNOSIS — F250 Schizoaffective disorder, bipolar type: Secondary | ICD-10-CM
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
## 2014-10-15 NOTE — ED Notes (Signed)
Pt alert and cooperative. Denies SI/HI/A/V/H @ present "I'm stable".  Emotional support and encouragement given. Will continue to monitor closely and evaluate for stabilization.

## 2014-10-16 MED ORDER — DIVALPROEX SODIUM 250 MG PO DR TAB
250.0000 mg | DELAYED_RELEASE_TABLET | Freq: Once | ORAL | Status: AC
  Administered 2014-10-16: 250 mg via ORAL
  Filled 2014-10-16: qty 1

## 2014-10-16 MED ORDER — VITAMIN D 1000 UNITS PO TABS: 1000.0000 [IU] | ORAL_TABLET | Freq: Every day | ORAL | Status: AC

## 2014-10-16 MED ORDER — DIVALPROEX SODIUM 250 MG PO DR TAB: 750.0000 mg | DELAYED_RELEASE_TABLET | Freq: Two times a day (BID) | ORAL | Status: DC

## 2014-10-16 MED ORDER — FERROUS SULFATE 325 (65 FE) MG PO TABS: 325.0000 mg | ORAL_TABLET | Freq: Every day | ORAL | Status: AC

## 2014-10-16 MED ORDER — OLANZAPINE 20 MG PO TABS: 30.0000 mg | ORAL_TABLET | Freq: Every day | ORAL | Status: AC

## 2014-10-16 MED ORDER — BENZTROPINE MESYLATE 0.5 MG PO TABS: 0.5000 mg | ORAL_TABLET | Freq: Two times a day (BID) | ORAL | Status: AC

## 2014-10-16 MED ORDER — DIVALPROEX SODIUM 500 MG PO DR TAB: 750.0000 mg | DELAYED_RELEASE_TABLET | Freq: Two times a day (BID) | ORAL | Status: DC

## 2014-10-16 NOTE — ED Notes (Signed)
Discharge instructions/ scripts reviewed with pt and group home staff. All belongings returned to pt. Pt denies pain or physical discomfort. Pt discharged to group home.

## 2014-10-16 NOTE — [facility] Suicide Risk Assessment (Signed)
    [facility] Discharge Suicide Risk Assessment   Demographic Factors:  Male, Low socioeconomic status and Unemployed  Total Time spent with patient: 25 minutes  Musculoskeletal: Strength & Muscle Tone: within normal limits Gait & Station: normal Patient leans: N/A  Psychiatric Specialty Exam:     Blood pressure 103/81, pulse 75, temperature 97.8 F (36.6 C), temperature source Oral, resp. rate 18, SpO2 100 %.There is no weight on file to calculate BMI.     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 (Cigarettes, Smokeless Tobacco, Cigars, and/or Pipes) Yes, A prescription for an FDA-approved tobacco cessation medication was offered at discharge and the patient refused   Mental Status Per Nursing Assessment::   On Admission:     Current Mental Status by Physician:  Pt has improved and has been calm, cooperative, alert/oriented x4, and appropriate with providers and nursing staff. Pt denies suicidal/homicidal ideation and psychosis and does not appear to be responding to internal stimuli. Pt in agreement to return to group home and is requesting discharge. Pt is appropriate for discharge at this time and  can follow-up with outpatient treatment.   Loss Factors: Decrease in vocational status  Historical Factors: Impulsivity  Risk Reduction Factors:   Living with another person, especially a relative, Positive social support and Positive therapeutic relationship  Continued Clinical Symptoms:  Severe Anxiety and/or Agitation Depression:   Anhedonia Impulsivity More than one psychiatric diagnosis  Cognitive Features That Contribute To Risk:  Polarized thinking    Suicide Risk:  Minimal: No identifiable suicidal ideation.  Patients presenting with no risk factors but with morbid ruminations; may be classified as minimal risk based on the severity of the depressive symptoms  Principal Problem: Schizoaffective disorder, mixed type Discharge Diagnoses:  Patient Active Problem List   Diagnosis Date Noted  . Schizoaffective disorder, mixed type [F25.8] 10/15/2014    Priority: High  . Hyponatremia [E87.1] 08/18/2014  . Anemia [D64.9] 08/18/2014  . Vitamin D deficiency [E55.9] 08/18/2014  . Tobacco abuse [Z72.0] 08/18/2014      Plan Of Care/Follow-up recommendations:  Activity:  As tolerated Diet:  Heart healthy with low sodium.  Is patient on multiple antipsychotic therapies at discharge:  No   Has Patient had three or more failed trials of antipsychotic monotherapy by history:  No  Recommended Plan for Multiple Antipsychotic Therapies: NA    Nazifa Trinka, Everardo All, FNP-BC 10/16/2014, 10:47 AM

## 2014-10-16 NOTE — Progress Notes (Addendum)
2:57pm. CSW spoke with pt's group home owner, Avelino Leeds. Lewis to come get pt at 6:00pm. Lewis requests FL-2. While unusual in ED, CSW agreed in this case. MD Effie Shy signed this FL-2. Rx, FL-2 and other paperwork on charts and RN aware of discharge.  York Spaniel [facility] Clinical Social Worker [facility] phone: [phone]
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
## 2014-10-16 NOTE — ED Notes (Signed)
Pt alert and cooperative. Denies SI/HI/A/V/H. Emotional support and encouragement given. Will continue to monitor closely and evaluate for stabilization.
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
## 2014-11-30 ENCOUNTER — Emergency Department (HOSPITAL_COMMUNITY)
Admission: EM | Admit: 2014-11-30 | Discharge: 2014-11-30 | Disposition: A | Discharge status: Other Acute Inpatient Hospital | Payer: Medicaid Other | Attending: Emergency Medicine | Admitting: Emergency Medicine

## 2014-11-30 ENCOUNTER — Encounter (HOSPITAL_COMMUNITY): Payer: Self-pay | Admitting: Emergency Medicine

## 2014-11-30 DIAGNOSIS — Z79899 Other long term (current) drug therapy: Secondary | ICD-10-CM | POA: Insufficient documentation

## 2014-11-30 DIAGNOSIS — Z720 Tobacco use: Secondary | ICD-10-CM | POA: Insufficient documentation

## 2014-11-30 DIAGNOSIS — J45909 Unspecified asthma, uncomplicated: Secondary | ICD-10-CM | POA: Insufficient documentation

## 2014-11-30 DIAGNOSIS — Z8719 Personal history of other diseases of the digestive system: Secondary | ICD-10-CM | POA: Diagnosis not present

## 2014-11-30 DIAGNOSIS — F209 Schizophrenia, unspecified: Secondary | ICD-10-CM | POA: Insufficient documentation

## 2014-11-30 DIAGNOSIS — Z008 Encounter for other general examination: Secondary | ICD-10-CM | POA: Diagnosis not present

## 2014-11-30 DIAGNOSIS — Z046 Encounter for general psychiatric examination, requested by authority: Secondary | ICD-10-CM

## 2014-11-30 LAB — SALICYLATE LEVEL

## 2014-11-30 LAB — RAPID URINE DRUG SCREEN, HOSP PERFORMED
Amphetamines: NOT DETECTED
Barbiturates: NOT DETECTED
Benzodiazepines: NOT DETECTED
COCAINE: NOT DETECTED
OPIATES: NOT DETECTED
TETRAHYDROCANNABINOL: NOT DETECTED

## 2014-11-30 LAB — COMPREHENSIVE METABOLIC PANEL
ALT: 7 U/L — AB (ref 17–63)
AST: 12 U/L — ABNORMAL LOW (ref 15–41)
Albumin: 3.1 g/dL — ABNORMAL LOW (ref 3.5–5.0)
Alkaline Phosphatase: 74 U/L (ref 38–126)
Anion gap: 6 (ref 5–15)
BUN: 9 mg/dL (ref 6–20)
CHLORIDE: 106 mmol/L (ref 101–111)
CO2: 27 mmol/L (ref 22–32)
CREATININE: 0.89 mg/dL (ref 0.61–1.24)
Calcium: 10.6 mg/dL — ABNORMAL HIGH (ref 8.9–10.3)
GFR calc non Af Amer: >60 mL/min (ref >60)
Glucose, Bld: 87 mg/dL (ref 65–99)
Potassium: 4.3 mmol/L (ref 3.5–5.1)
Sodium: 139 mmol/L (ref 135–145)
Total Bilirubin: 0.5 mg/dL (ref 0.3–1.2)
Total Protein: 7.9 g/dL (ref 6.5–8.1)

## 2014-11-30 LAB — CBC
HCT: 34 % — ABNORMAL LOW (ref 39.0–52.0)
HEMOGLOBIN: 10.9 g/dL — AB (ref 13.0–17.0)
MCH: 32.4 pg (ref 26.0–34.0)
MCHC: 32.1 g/dL (ref 30.0–36.0)
MCV: 101.2 fL — ABNORMAL HIGH (ref 78.0–100.0)
Platelets: 406 10*3/uL — ABNORMAL HIGH (ref 150–400)
RBC: 3.36 MIL/uL — AB (ref 4.22–5.81)
RDW: 13.5 % (ref 11.5–15.5)
WBC: 6.1 10*3/uL (ref 4.0–10.5)

## 2014-11-30 LAB — ETHANOL: Alcohol, Ethyl (B): <5 mg/dL (ref <5)

## 2014-11-30 LAB — ACETAMINOPHEN LEVEL

## 2014-11-30 NOTE — ED Notes (Signed)
Pt brought over from [facility] for medical clearance. Pt is under IVC. Paperwork states "Danger to self and others. Respondent has been diagnosed bipolar and schizoaffective disorder among other psychiatric conditions. Respondent is on several medications to treat these disorders. [facility] Respondent has not been eating regularly for a week according to petitioner. Respondent is a resident at [facility]" group home in [place]. Respondent has stated to petitioner that he is 'going to kill somebody', and has listed other group home clients by name".
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
## 2015-01-08 ENCOUNTER — Encounter (HOSPITAL_COMMUNITY): Payer: Self-pay | Admitting: Emergency Medicine

## 2015-01-08 ENCOUNTER — Emergency Department (HOSPITAL_COMMUNITY): Payer: Medicaid Other

## 2015-01-08 ENCOUNTER — Emergency Department (HOSPITAL_COMMUNITY)
Admission: EM | Admit: 2015-01-08 | Discharge: 2015-01-08 | Disposition: A | Discharge status: Home / Self Care | Payer: Medicaid Other | Attending: Emergency Medicine | Admitting: Emergency Medicine

## 2015-01-08 DIAGNOSIS — Z8719 Personal history of other diseases of the digestive system: Secondary | ICD-10-CM | POA: Insufficient documentation

## 2015-01-08 DIAGNOSIS — Z720 Tobacco use: Secondary | ICD-10-CM | POA: Diagnosis not present

## 2015-01-08 DIAGNOSIS — F209 Schizophrenia, unspecified: Secondary | ICD-10-CM | POA: Insufficient documentation

## 2015-01-08 DIAGNOSIS — R05 Cough: Secondary | ICD-10-CM | POA: Diagnosis present

## 2015-01-08 DIAGNOSIS — J45901 Unspecified asthma with (acute) exacerbation: Secondary | ICD-10-CM | POA: Insufficient documentation

## 2015-01-08 DIAGNOSIS — J40 Bronchitis, not specified as acute or chronic: Secondary | ICD-10-CM

## 2015-01-08 DIAGNOSIS — Z79899 Other long term (current) drug therapy: Secondary | ICD-10-CM | POA: Diagnosis not present

## 2015-01-08 MED ORDER — DEXAMETHASONE 4 MG PO TABS: 4.0000 mg | ORAL_TABLET | Freq: Two times a day (BID) | ORAL | Status: DC

## 2015-01-08 MED ORDER — DEXAMETHASONE 4 MG PO TABS
12.0000 mg | ORAL_TABLET | Freq: Once | ORAL | Status: AC
  Administered 2015-01-08: 12 mg via ORAL
  Filled 2015-01-08: qty 3

## 2015-01-08 MED ORDER — BENZONATATE 200 MG PO CAPS: 200.0000 mg | ORAL_CAPSULE | Freq: Three times a day (TID) | ORAL | Status: DC | PRN

## 2015-01-08 NOTE — ED Notes (Signed)
Pt. oob to the bathroom, gait steady denies any dizziness or lightheaded

## 2015-01-08 NOTE — ED Notes (Signed)
Spoke with pt.s caregiver, updated her on plan of care and what time to come and pick up pt.  Spoke with.  Offered pt. Something to eat or drink.  He stated, "no", Warm blankets given

## 2015-01-08 NOTE — ED Notes (Signed)
Care giver stated, He's had a cough with congestion for 2 weeks.  Pt. Lives in a Group Home.

## 2015-01-10 NOTE — ED Provider Notes (Signed)
CSN: [id number]     Arrival date & time 01/08/15  7829 History   First MD Initiated Contact with Patient 01/08/15 [phone]     Chief Complaint  Patient presents with  . Cough  . Nasal Congestion     (Consider location/radiation/quality/duration/timing/severity/associated sxs/prior Treatment) HPI   54 year old male with cough and congestion. Symptom onset about 2 weeks ago. Cough is nonproductive. No fevers or chills. No unusual leg swelling. No sick contacts. Occasional wheezing. No shortness of breath. Smoker.   Past Medical History  Diagnosis Date  . Schizophrenia (HCC)   . Crohn's disease (HCC)   . Asthma    Past Surgical History  Procedure Laterality Date  . No past surgeries     No family history on file. Social History  Substance Use Topics  . Smoking status: Current Every Day Smoker -- 0.50 packs/day  . Smokeless tobacco: None  . Alcohol Use: No    Review of Systems  All systems reviewed and negative, other than as noted in HPI.   Allergies  Lithium and Thorazine  Home Medications   Prior to Admission medications   Medication Sig Start Date End Date Taking? Authorizing Provider  benztropine (COGENTIN) 0.5 MG tablet Take 1 tablet (0.5 mg total) by mouth 2 (two) times daily. 10/16/14  Yes Beau Fanny, FNP  cetirizine (ZYRTEC) 10 MG tablet Take 10 mg by mouth daily.   Yes Historical Provider, MD  cholecalciferol (VITAMIN D) 1000 UNITS tablet Take 1 tablet (1,000 Units total) by mouth daily. 10/16/14  Yes Beau Fanny, FNP  divalproex (DEPAKOTE) 500 MG DR tablet Take 500 mg by mouth 2 (two) times daily.   Yes Historical Provider, MD  ferrous sulfate 325 (65 FE) MG tablet Take 1 tablet (325 mg total) by mouth daily. 10/16/14  Yes Beau Fanny, FNP  haloperidol (HALDOL) 5 MG tablet Take 5 mg by mouth every 8 (eight) hours as needed for agitation.   Yes Historical Provider, MD  OLANZapine (ZYPREXA) 20 MG tablet Take 1.5 tablets (30 mg total) by mouth at bedtime.  10/16/14  Yes Beau Fanny, FNP  paliperidone (INVEGA SUSTENNA) 234 MG/1.5ML SUSP injection Inject 234 mg into the muscle every 28 (twenty-eight) days.   Yes Historical Provider, MD  traZODone (DESYREL) 50 MG tablet Take 50 mg by mouth at bedtime.   Yes Historical Provider, MD  benzonatate (TESSALON) 200 MG capsule Take 1 capsule (200 mg total) by mouth 3 (three) times daily as needed for cough. 01/08/15   Raeford Razor, MD  dexamethasone (DECADRON) 4 MG tablet Take 1 tablet (4 mg total) by mouth 2 (two) times daily. 01/08/15   Raeford Razor, MD   BP 110/70 mmHg  Pulse 83  Temp(Src) 98.8 F (37.1 C)  Resp 16  Ht 6' (1.829 m)  Wt 144 lb (65.318 kg)  BMI 19.53 kg/m2  SpO2 99% Physical Exam  Constitutional: He appears well-developed and well-nourished. No distress.  HENT:  Head: Normocephalic and atraumatic.  Eyes: Conjunctivae are normal. Right eye exhibits no discharge. Left eye exhibits no discharge.  Neck: Neck supple.  Cardiovascular: Normal rate, regular rhythm and normal heart sounds.  Exam reveals no gallop and no friction rub.   No murmur heard. Pulmonary/Chest: Effort normal and breath sounds normal. No respiratory distress.  Abdominal: Soft. He exhibits no distension. There is no tenderness.  Musculoskeletal: He exhibits no edema or tenderness.  Lower extremities symmetric as compared to each other. No calf tenderness. Negative Homan's. No palpable  cords.   Neurological: He is alert.  Skin: Skin is warm and dry.  Psychiatric: He has a normal mood and affect. His behavior is normal. Thought content normal.  Nursing note and vitals reviewed.   ED Course  Procedures (including critical care time) Labs Review Labs Reviewed - No data to display  Imaging Review No results found. I have personally reviewed and evaluated these images and lab results as part of my medical decision-making.  Dg Chest 2 View  01/08/2015  CLINICAL DATA:  Cough since yesterday. EXAM: CHEST  2  VIEW COMPARISON:  09/16/2013 and 08/29/2010 FINDINGS: Lungs are hyperinflated without focal consolidation or effusion. Cardiomediastinal silhouette is within normal. Minimal calcified plaque over the aortic arch. Bones and soft tissues are within normal. IMPRESSION: Hyperinflation without acute cardiopulmonary disease. Electronically Signed   By: Elberta Fortis M.D.   On: 01/08/2015 10:33     EKG Interpretation None      MDM   Final diagnoses:  Bronchitis    54 year old male with persistent cough and congestion. Suspect bronchitis. Imaged without acute abnormality. Afebrile. Generally appears well. No increased work of breathing. Plan symptomatic treatment. It has been determined that no acute conditions requiring further emergency intervention are present at this time. The patient has been advised of the diagnosis and plan. I reviewed any labs and imaging including any potential incidental findings. We have discussed signs and symptoms that warrant return to the ED and they are listed in the discharge instructions.      Raeford Razor, MD 01/10/15 [phone]

## 2015-09-02 ENCOUNTER — Emergency Department (HOSPITAL_BASED_OUTPATIENT_CLINIC_OR_DEPARTMENT_OTHER)
Admission: EM | Admit: 2015-09-02 | Discharge: 2015-09-02 | Disposition: A | Discharge status: Home / Self Care | Payer: Medicaid Other | Attending: Emergency Medicine | Admitting: Emergency Medicine

## 2015-09-02 ENCOUNTER — Encounter (HOSPITAL_BASED_OUTPATIENT_CLINIC_OR_DEPARTMENT_OTHER): Payer: Self-pay | Admitting: Emergency Medicine

## 2015-09-02 DIAGNOSIS — F17200 Nicotine dependence, unspecified, uncomplicated: Secondary | ICD-10-CM | POA: Diagnosis not present

## 2015-09-02 DIAGNOSIS — Z760 Encounter for issue of repeat prescription: Secondary | ICD-10-CM

## 2015-09-02 DIAGNOSIS — F209 Schizophrenia, unspecified: Secondary | ICD-10-CM | POA: Insufficient documentation

## 2015-09-02 MED ORDER — HALOPERIDOL 5 MG PO TABS: 5.0000 mg | ORAL_TABLET | Freq: Three times a day (TID) | ORAL | Status: AC | PRN

## 2015-09-02 MED ORDER — HALOPERIDOL 5 MG PO TABS
5.0000 mg | ORAL_TABLET | Freq: Three times a day (TID) | ORAL | Status: DC | PRN
  Administered 2015-09-02: 5 mg via ORAL
  Filled 2015-09-02: qty 1

## 2015-09-02 NOTE — ED Notes (Signed)
Mr. Willeen Cass notified to come pick pt up, will be here in 30 minutes.

## 2015-09-02 NOTE — ED Notes (Signed)
Pt cooperative and answering questions in triage, per caregiver at "home" pt has become verbally abusive and wanting to change to a new home, per patient he makes his own decisions, when questioned by gentleman [facility] he has no idea who has legal decision making skills.

## 2015-09-02 NOTE — Discharge Instructions (Signed)
Medicine Refill at the Emergency Department We have refilled your medicine today, but it is best for you to get refills through your primary health care provider's office. In the future, please plan ahead so you do not need to get refills from the emergency department. If the medicine we refilled was a maintenance medicine, you may have received only enough to get you by until you are able to see your regular health care provider.   This information is not intended to replace advice given to you by your health care provider. Make sure you discuss any questions you have with your health care provider.   Document Released: 06/21/2003 Document Revised: 03/25/2014 Document Reviewed: 06/11/2013 Elsevier Interactive Patient Education 2016 [employer]. Substance Abuse Treatment Programs  Intensive Outpatient Programs [facility] Services     [address]      [place], [place]                   [phone]       [facility] [address] [place] [address] [place], [place] [phone]  [facility]     (Inpatient and outpatient)     [address]           [phone]    [facility] [phone] (Suboxone and Methadone)  [address]      [place], [place] [address]      [phone]       [address] [place], [place] [phone]  [facility] (Outpatient/Inpatient, Chemical)    (insurance only) [phone]             [facility] (Groups & Residential) [place], [place] [phone]     [facility]     [address]     [place], [place]      [phone]       [facility] (for caregivers and family) [phone] [address] [place], [place] [phone]      Residential Treatment Programs [facility]      [address], [place], [place] [address]  [phone]       [facility] [address] [place], [place] [address] [phone]  [facility] [place]                [phone]       [facility] [phone]  [facility] ([facility]             [address]                                         [place], [place]                                                [phone] or [phone]                               [facility] [place] [address] [place], [address] [phone]  [facility]    [address]      [place], [place]     [phone]       [facility] [employer] [phone] [address]  [place], [place] [phone]  [facility]   [address] [place], [place] [address]     [phone]      Admissions: 8am-3pm M-F  [facility] (RT[facility]) [address] [place], [place] [phone]  [facility]: Residential Program [phone] Days)   [place], [place]      [phone] or [phone]     [facility]: [facility] [place], [place] (Walk in Hours over the weekend or by referral)  [facility] [address] [place], [place], [place] [address] [phone]  Crisis Mobile: [facility]:  [phone] (for crisis response 24 hours a day) [facility] Hotline:      [phone] Outpatient Psychiatry and Counseling  [facility]: Mobile Crisis Management 24 hours:  [phone]  [facility] of the [employer] sliding scale fee and walk in schedule: M-F 8am-12pm/1pm-3pm [address]  [place], [place] [address] [phone]  [facility] [address] [place], [place] [address] [phone]  [facility] (Formerly [facility] as [facility] [employer])- new patient walk-in appointments available Monday - Friday 8am -3pm.          [address] [place], [place] [address] [phone] or crisis line- [phone]  [facility] Outpatient Services/ Intensive Outpatient Therapy Program [address] [place], [place]  [address] [phone]  [facility]                  Crisis Services      [phone] [address]     [place], [place] [address]                 [facility]   [facility] [phone]. [address] [place], [place] [address]   [employer] of Care          [address]  [place], [place] [address]       [phone]  [facility] [address] [place], [place] [address] [phone]  [facility]    [address]    [place], [place] [address]     [phone]       Andee Poles, MD     [address]     [place] [address]     [phone]       [facility] [address] [place] [address]  [facility]     [address] [place], [place]      [phone]       [facility] Eulogio Ditch, MD [address] [place], [place] [address] [phone]  [facility]     [address]     [place], [place] [address]     [phone]       [facility] [address] [place], [place] [address] [phone] Resources for Temporary Residential Assistance/Crisis Centers  DAY CENTERS [facility]) M-F 8am-3pm   [address] [place], [place] [address]   [phone] Services include: laundry, barbering, support groups, case management, phone  & computer access, showers, AA/NA mtgs, mental health/substance abuse nurse, job skills class, disability information, VA assistance, spiritual classes, etc.   HOMELESS SHELTERS  [facility] Ministry     [facility]   [address] [place]  [place]              [employer] (women and children)       [address] [place], [place] [address] [phone] [email or web address] for application and process Application Required  [facility]   [address]    [place] [address]     [phone]                    [facility] of [place] [address] [place], [place] [address] [phone] application appt.) Application Required  [facility] (women only)    [address]     [place], [place] [address]     [phone]      Intake starts 6pm daily Need valid ID, SSC, & Police report [employer] [address] [place], [place] [phone] Application Required  [employer] (men only)     [address]      [place], [place]     [phone]       [facility] (Pregnant women only) [address] [place], [place] [phone]  [facility]      [address]      [place], [place] [address]     [phone]             [facility] [address] [place], [place] [phone] 90 day commitment/SA/Application process  [facility](men only)     [address]     [place], [place]     [phone]       Check-in at [facility] [address] [place], [place] [address] [phone] Men/Women/Women and Children must be there by 7 pm  [facility] [place], [place] [phone]

## 2015-09-02 NOTE — ED Notes (Signed)
Pt made aware to return if symptoms worsen or if any life threatening symptoms occur.   

## 2015-09-02 NOTE — ED Notes (Signed)
[facility] and was brought her today because of increased agitation and out of medications, per caretaker pt has been out of medications x 1 day. [facility] with medications and has not been taken for follow up

## 2015-09-02 NOTE — ED Provider Notes (Signed)
CSN: [id number]     Arrival date & time 09/02/15  2042 History  By signing my name below, I, Bethel Born, attest that this documentation has been prepared under the direction and in the presence of Linwood Dibbles, MD. Electronically Signed: Bethel Born, ED Scribe. 09/02/2015. 10:22 PM   Chief Complaint  Patient presents with  . Medication Refill    The history is provided by the patient. The history is limited by the absence of a caregiver. No language interpreter was used.   Jeff Long is a 55 y.o. male with PMHx of schizophrenia who presents to the Emergency Department for a medication refill. Pt states that he thinks he is here for a refill of his haldol. He denies SI, HI, and depression.  Pt states he would like to go home.  He denies any medical problems.  Nursing notes indicates that the patient was verbally abusive at his home and has run out of his haldol for the last day.   Past Medical History  Diagnosis Date  . Schizophrenia (HCC)   . Crohn's disease (HCC)   . Asthma    Past Surgical History  Procedure Laterality Date  . No past surgeries     History reviewed. No pertinent family history. Social History  Substance Use Topics  . Smoking status: Current Every Day Smoker -- 0.50 packs/day  . Smokeless tobacco: None  . Alcohol Use: No    Review of Systems  Psychiatric/Behavioral: Negative for suicidal ideas.  All other systems reviewed and are negative.   Allergies  Lithium and Thorazine  Home Medications   Prior to Admission medications   Medication Sig Start Date End Date Taking? Authorizing Provider  benzonatate (TESSALON) 200 MG capsule Take 1 capsule (200 mg total) by mouth 3 (three) times daily as needed for cough. 01/08/15   Raeford Razor, MD  benztropine (COGENTIN) 0.5 MG tablet Take 1 tablet (0.5 mg total) by mouth 2 (two) times daily. 10/16/14   Beau Fanny, FNP  cetirizine (ZYRTEC) 10 MG tablet Take 10 mg by mouth daily.    Historical  Provider, MD  cholecalciferol (VITAMIN D) 1000 UNITS tablet Take 1 tablet (1,000 Units total) by mouth daily. 10/16/14   Beau Fanny, FNP  dexamethasone (DECADRON) 4 MG tablet Take 1 tablet (4 mg total) by mouth 2 (two) times daily. 01/08/15   Raeford Razor, MD  divalproex (DEPAKOTE) 500 MG DR tablet Take 500 mg by mouth 2 (two) times daily.    Historical Provider, MD  ferrous sulfate 325 (65 FE) MG tablet Take 1 tablet (325 mg total) by mouth daily. 10/16/14   Beau Fanny, FNP  haloperidol (HALDOL) 5 MG tablet Take 5 mg by mouth every 8 (eight) hours as needed for agitation.    Historical Provider, MD  OLANZapine (ZYPREXA) 20 MG tablet Take 1.5 tablets (30 mg total) by mouth at bedtime. 10/16/14   Beau Fanny, FNP  paliperidone (INVEGA SUSTENNA) 234 MG/1.5ML SUSP injection Inject 234 mg into the muscle every 28 (twenty-eight) days.    Historical Provider, MD  traZODone (DESYREL) 50 MG tablet Take 50 mg by mouth at bedtime.    Historical Provider, MD   BP 103/73 mmHg  Pulse 104  Temp(Src) 98.2 F (36.8 C) (Oral)  Resp 16  Ht 5\' 11"  (1.803 m)  Wt 165 lb (74.844 kg)  BMI 23.02 kg/m2  SpO2 98% Physical Exam  Constitutional: He appears well-developed and well-nourished. No distress.  HENT:  Head: Normocephalic  and atraumatic.  Right Ear: External ear normal.  Left Ear: External ear normal.  Eyes: Conjunctivae are normal. Right eye exhibits no discharge. Left eye exhibits no discharge. No scleral icterus.  Neck: Neck supple. No tracheal deviation present.  Cardiovascular: Normal rate.   Pulmonary/Chest: Effort normal. No stridor. No respiratory distress.  Musculoskeletal: He exhibits no edema.  Neurological: He is alert. Cranial nerve deficit: no gross deficits.  Skin: Skin is warm and dry. No rash noted.  Psychiatric: He has a normal mood and affect. His speech is normal and behavior is normal. Judgment normal. His affect is not angry, not labile and not inappropriate. He does not  exhibit a depressed mood. He expresses no homicidal and no suicidal ideation.  Nursing note and vitals reviewed.   ED Course  Procedures (including critical care time) DIAGNOSTIC STUDIES: Oxygen Saturation is 98% on RA,  normal by my interpretation.    COORDINATION OF CARE: 10:21 PM Discussed treatment plan which includes medication refill with pt at bedside and pt agreed to plan.   MDM   Final diagnoses:  Medication refill   Pt is appropriate in the ED.  He is not agitated and is calm and cooperative.  Pt states he ran out of his haldol.  He agrees to taking a dose of his oral haldol.  I will give him a refill of that prescription.  Pt does not appear to require acute psychiatric treatment  I personally performed the services described in this documentation, which was scribed in my presence.  The recorded information has been reviewed and is accurate.    Linwood Dibbles, MD 09/02/15 2227

## 2016-08-10 DIAGNOSIS — F250 Schizoaffective disorder, bipolar type: Secondary | ICD-10-CM | POA: Insufficient documentation

## 2016-08-10 DIAGNOSIS — F17200 Nicotine dependence, unspecified, uncomplicated: Secondary | ICD-10-CM | POA: Insufficient documentation

## 2016-08-10 DIAGNOSIS — R440 Auditory hallucinations: Secondary | ICD-10-CM | POA: Diagnosis present

## 2016-08-10 DIAGNOSIS — G4700 Insomnia, unspecified: Secondary | ICD-10-CM | POA: Diagnosis not present

## 2016-08-10 DIAGNOSIS — J45909 Unspecified asthma, uncomplicated: Secondary | ICD-10-CM | POA: Insufficient documentation

## 2016-08-10 DIAGNOSIS — Z79899 Other long term (current) drug therapy: Secondary | ICD-10-CM | POA: Insufficient documentation

## 2016-08-10 DIAGNOSIS — F17210 Nicotine dependence, cigarettes, uncomplicated: Secondary | ICD-10-CM | POA: Diagnosis not present

## 2016-08-10 DIAGNOSIS — F29 Unspecified psychosis not due to a substance or known physiological condition: Secondary | ICD-10-CM | POA: Diagnosis not present

## 2016-08-11 ENCOUNTER — Encounter (HOSPITAL_COMMUNITY): Payer: Self-pay

## 2016-08-11 ENCOUNTER — Emergency Department (HOSPITAL_COMMUNITY)
Admission: EM | Admit: 2016-08-11 | Discharge: 2016-08-12 | Disposition: A | Discharge status: Home / Self Care | Payer: Medicaid Other | Attending: Emergency Medicine | Admitting: Emergency Medicine

## 2016-08-11 DIAGNOSIS — E871 Hypo-osmolality and hyponatremia: Secondary | ICD-10-CM

## 2016-08-11 DIAGNOSIS — F250 Schizoaffective disorder, bipolar type: Secondary | ICD-10-CM | POA: Diagnosis present

## 2016-08-11 LAB — CBC
HEMATOCRIT: 29.9 % — AB (ref 39.0–52.0)
Hemoglobin: 9.9 g/dL — ABNORMAL LOW (ref 13.0–17.0)
MCH: 31.9 pg (ref 26.0–34.0)
MCHC: 33.1 g/dL (ref 30.0–36.0)
MCV: 96.5 fL (ref 78.0–100.0)
Platelets: 374 10*3/uL (ref 150–400)
RBC: 3.1 MIL/uL — ABNORMAL LOW (ref 4.22–5.81)
RDW: 13 % (ref 11.5–15.5)
WBC: 6.7 10*3/uL (ref 4.0–10.5)

## 2016-08-11 LAB — COMPREHENSIVE METABOLIC PANEL
ALBUMIN: 3.3 g/dL — AB (ref 3.5–5.0)
ALK PHOS: 69 U/L (ref 38–126)
ALT: 8 U/L — AB (ref 17–63)
ANION GAP: 6 (ref 5–15)
AST: 13 U/L — ABNORMAL LOW (ref 15–41)
BILIRUBIN TOTAL: 0.2 mg/dL — AB (ref 0.3–1.2)
BUN: 10 mg/dL (ref 6–20)
CALCIUM: 10.7 mg/dL — AB (ref 8.9–10.3)
CO2: 26 mmol/L (ref 22–32)
CREATININE: 1.1 mg/dL (ref 0.61–1.24)
Chloride: 96 mmol/L — ABNORMAL LOW (ref 101–111)
GFR calc non Af Amer: >60 mL/min (ref >60)
GLUCOSE: 82 mg/dL (ref 65–99)
Potassium: 4.5 mmol/L (ref 3.5–5.1)
Sodium: 128 mmol/L — ABNORMAL LOW (ref 135–145)
TOTAL PROTEIN: 7.8 g/dL (ref 6.5–8.1)

## 2016-08-11 LAB — ACETAMINOPHEN LEVEL: Acetaminophen (Tylenol), Serum: <10 ug/mL — ABNORMAL LOW (ref 10–30)

## 2016-08-11 LAB — SALICYLATE LEVEL: Salicylate Lvl: <7 mg/dL (ref 2.8–30.0)

## 2016-08-11 LAB — ETHANOL: Alcohol, Ethyl (B): <5 mg/dL (ref <5)

## 2016-08-11 LAB — RAPID URINE DRUG SCREEN, HOSP PERFORMED
Amphetamines: NOT DETECTED
BARBITURATES: NOT DETECTED
Benzodiazepines: NOT DETECTED
COCAINE: NOT DETECTED
Opiates: NOT DETECTED
TETRAHYDROCANNABINOL: NOT DETECTED

## 2016-08-11 MED ORDER — ONDANSETRON HCL 4 MG PO TABS: 4.0000 mg | ORAL_TABLET | Freq: Three times a day (TID) | ORAL | Status: DC | PRN

## 2016-08-11 MED ORDER — BENZTROPINE MESYLATE 0.5 MG PO TABS
0.5000 mg | ORAL_TABLET | Freq: Two times a day (BID) | ORAL | Status: DC
  Administered 2016-08-11 – 2016-08-12 (×3): 0.5 mg via ORAL
  Filled 2016-08-11 (×3): qty 1

## 2016-08-11 MED ORDER — OLANZAPINE 10 MG PO TABS
30.0000 mg | ORAL_TABLET | Freq: Every day | ORAL | Status: DC
  Administered 2016-08-11: 30 mg via ORAL
  Filled 2016-08-11: qty 3

## 2016-08-11 MED ORDER — HALOPERIDOL 5 MG PO TABS: 5.0000 mg | ORAL_TABLET | Freq: Three times a day (TID) | ORAL | Status: DC | PRN

## 2016-08-11 MED ORDER — IBUPROFEN 200 MG PO TABS: 600.0000 mg | ORAL_TABLET | Freq: Three times a day (TID) | ORAL | Status: DC | PRN

## 2016-08-11 MED ORDER — TRAZODONE HCL 50 MG PO TABS
50.0000 mg | ORAL_TABLET | Freq: Every day | ORAL | Status: DC
  Administered 2016-08-11: 50 mg via ORAL
  Filled 2016-08-11: qty 1

## 2016-08-11 MED ORDER — FERROUS SULFATE 325 (65 FE) MG PO TABS
325.0000 mg | ORAL_TABLET | Freq: Every day | ORAL | Status: DC
  Administered 2016-08-11 – 2016-08-12 (×2): 325 mg via ORAL
  Filled 2016-08-11 (×4): qty 1

## 2016-08-11 MED ORDER — ZOLPIDEM TARTRATE 5 MG PO TABS: 5.0000 mg | ORAL_TABLET | Freq: Every evening | ORAL | Status: DC | PRN

## 2016-08-11 MED ORDER — FLUOXETINE HCL 20 MG PO CAPS
20.0000 mg | ORAL_CAPSULE | Freq: Every day | ORAL | Status: DC
  Administered 2016-08-11 – 2016-08-12 (×2): 20 mg via ORAL
  Filled 2016-08-11 (×3): qty 1

## 2016-08-11 MED ORDER — DIVALPROEX SODIUM 500 MG PO DR TAB
500.0000 mg | DELAYED_RELEASE_TABLET | Freq: Two times a day (BID) | ORAL | Status: DC
Start: 2016-08-11 — End: 2016-08-12
  Administered 2016-08-11 – 2016-08-12 (×3): 500 mg via ORAL
  Filled 2016-08-11 (×3): qty 1

## 2016-08-11 NOTE — ED Notes (Signed)
Bed: WTR5 Expected date:  Expected time:  Means of arrival:  Comments: 

## 2016-08-11 NOTE — BH Assessment (Signed)
[facility] at capacity. Faxed clinical information to the following facilities for placement:  [facility] [place] [facility] [place], [facility], [facility], [facility] Triage Specialist [phone]
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
## 2016-08-11 NOTE — ED Notes (Signed)
Bed: XA12 Expected date:  Expected time:  Means of arrival:  Comments: TR 5
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
## 2016-08-11 NOTE — ED Provider Notes (Signed)
[facility]ENCY DEPT Provider Note   CSN: [id number] Arrival date & time: 08/10/16  2225     History   Chief Complaint Chief Complaint  Patient presents with  . Aggressive Behavior    HPI Jeff Long is a 56 y.o. male.  HPI Pt comes in with cc of hallucinations. PT has hx of bipolar, schizophrenia. Pt reports that for the last few days he has been hearing voices of dead people that are telling him to kill himself. Pt is also seeing shadows. He has not been compliant with all his meds. He doesn't use drugs. Pt denies nausea, emesis, fevers, chills, chest pains, shortness of breath, headaches, abdominal pain, uti like symptoms.   Past Medical History:  Diagnosis Date  . Asthma   . Crohn's disease (HCC)   . Schizophrenia [facility])     Patient Active Problem List   Diagnosis Date Noted  . Schizoaffective disorder, mixed type (HCC) 10/15/2014  . Hyponatremia 08/18/2014  . Anemia 08/18/2014  . Vitamin D deficiency 08/18/2014  . Tobacco abuse 08/18/2014    Past Surgical History:  Procedure Laterality Date  . NO PAST SURGERIES         Home Medications    Prior to Admission medications   Medication Sig Start Date End Date Taking? Authorizing Provider  benztropine (COGENTIN) 0.5 MG tablet Take 1 tablet (0.5 mg total) by mouth 2 (two) times daily. 10/16/14  Yes Withrow, Everardo All, FNP  cholecalciferol (VITAMIN D) 1000 UNITS tablet Take 1 tablet (1,000 Units total) by mouth daily. Patient taking differently: Take 1,000 Units by mouth every morning.  10/16/14  Yes Withrow, Everardo All, FNP  divalproex (DEPAKOTE) 500 MG DR tablet Take 500-1,000 mg by mouth 2 (two) times daily. 500 mg every morning and 1000 mg every night   Yes [provider]  ferrous sulfate 325 (65 FE) MG tablet Take 1 tablet (325 mg total) by mouth daily. 10/16/14  Yes Withrow, Everardo All, FNP  FLUoxetine (PROZAC) 20 MG tablet Take 20 mg by mouth every morning.   Yes [provider]  haloperidol  (HALDOL) 5 MG tablet Take 1 tablet (5 mg total) by mouth every 8 (eight) hours as needed for agitation. Patient taking differently: Take 5 mg by mouth 2 (two) times daily.  09/02/15  Yes Linwood Dibbles, MD  OLANZapine (ZYPREXA) 20 MG tablet Take 1.5 tablets (30 mg total) by mouth at bedtime. Patient taking differently: Take 20 mg by mouth at bedtime.  10/16/14  Yes Withrow, Everardo All, FNP  Paliperidone Palmitate (INVEGA TRINZA) 819 MG/2.625ML SUSP Inject 819 mg into the muscle every 3 (three) months.   Yes [provider]  traZODone (DESYREL) 50 MG tablet Take 50 mg by mouth at bedtime.   Yes [provider]    Family History No family history on file.  Social History Social History  Substance Use Topics  . Smoking status: Current Every Day Smoker    Packs/day: 0.50  . Smokeless tobacco: Not on file  . Alcohol use No     Allergies   Lithium and Thorazine [chlorpromazine]   Review of Systems Review of Systems  Cardiovascular: Negative for chest pain.  Gastrointestinal: Negative for abdominal pain.  Skin: Negative for wound.  Psychiatric/Behavioral: Positive for hallucinations.  All other systems reviewed and are negative.    Physical Exam Updated Vital Signs BP (!) 146/93 (BP Location: Right Arm)   Pulse 81   Temp 97.5 F (36.4 C) (Oral)   Resp 16  Wt 64.4 kg (142 lb)   SpO2 99%   BMI 19.80 kg/m   Physical Exam  Constitutional: He is oriented to person, place, and time. He appears well-developed.  HENT:  Head: Normocephalic and atraumatic.  Eyes: Conjunctivae and EOM are normal. Pupils are equal, round, and reactive to light.  Neck: Normal range of motion. Neck supple.  Cardiovascular: Normal rate and regular rhythm.   Pulmonary/Chest: Effort normal and breath sounds normal.  Abdominal: Soft. Bowel sounds are normal. He exhibits no distension and no mass. There is no tenderness. There is no rebound and no guarding.  Musculoskeletal: He exhibits no  deformity.  Neurological: He is alert and oriented to person, place, and time.  Skin: Skin is warm.  Psychiatric: He has a normal mood and affect. His behavior is normal.  Nursing note and vitals reviewed.    ED Treatments / Results  Labs (all labs ordered are listed, but only abnormal results are displayed) Labs Reviewed  COMPREHENSIVE METABOLIC PANEL - Abnormal; Notable for the following:       Result Value   Sodium 128 (*)    Chloride 96 (*)    Calcium 10.7 (*)    Albumin 3.3 (*)    AST 13 (*)    ALT 8 (*)    Total Bilirubin 0.2 (*)    All other components within normal limits  ACETAMINOPHEN LEVEL - Abnormal; Notable for the following:    Acetaminophen (Tylenol), Serum <10 (*)    All other components within normal limits  CBC - Abnormal; Notable for the following:    RBC 3.10 (*)    Hemoglobin 9.9 (*)    HCT 29.9 (*)    All other components within normal limits  ETHANOL  SALICYLATE LEVEL  RAPID URINE DRUG SCREEN, HOSP PERFORMED  VALPROIC ACID LEVEL    EKG  EKG Interpretation None       Radiology No results found.  Procedures Procedures (including critical care time)  Medications Ordered in ED Medications  ondansetron (ZOFRAN) tablet 4 mg (not administered)  zolpidem (AMBIEN) tablet 5 mg (not administered)  ibuprofen (ADVIL,MOTRIN) tablet 600 mg (not administered)  benztropine (COGENTIN) tablet 0.5 mg (not administered)  divalproex (DEPAKOTE) DR tablet 500-1,000 mg (not administered)  ferrous sulfate tablet 325 mg (not administered)  FLUoxetine (PROZAC) tablet 20 mg (not administered)  haloperidol (HALDOL) tablet 5 mg (not administered)  OLANZapine (ZYPREXA) tablet 30 mg (not administered)  traZODone (DESYREL) tablet 50 mg (not administered)     Initial Impression / Assessment and Plan / ED Course  I have reviewed the triage vital signs and the nursing notes.  Pertinent labs & imaging results that were available during my care of the patient were  reviewed by me and considered in my medical decision making (see chart for details).     Pt is having psychoses. Pt cleared medically for psych evaluation.   Final Clinical Impressions(s) / ED Diagnoses   Final diagnoses:  Schizoaffective disorder, bipolar type [facility])    New Prescriptions New Prescriptions   No medications on file     Derwood Kaplan, MD 08/11/16 [phone]

## 2016-08-11 NOTE — ED Notes (Signed)
Pt has been wanded by security. 

## 2016-08-12 DIAGNOSIS — G4700 Insomnia, unspecified: Secondary | ICD-10-CM

## 2016-08-12 DIAGNOSIS — F250 Schizoaffective disorder, bipolar type: Secondary | ICD-10-CM | POA: Diagnosis not present

## 2016-08-12 DIAGNOSIS — F29 Unspecified psychosis not due to a substance or known physiological condition: Secondary | ICD-10-CM | POA: Diagnosis not present

## 2016-08-12 DIAGNOSIS — F17210 Nicotine dependence, cigarettes, uncomplicated: Secondary | ICD-10-CM

## 2016-08-12 LAB — BASIC METABOLIC PANEL
Anion gap: 6 (ref 5–15)
BUN: 9 mg/dL (ref 6–20)
CALCIUM: 10.3 mg/dL (ref 8.9–10.3)
CHLORIDE: 99 mmol/L — AB (ref 101–111)
CO2: 26 mmol/L (ref 22–32)
CREATININE: 0.93 mg/dL (ref 0.61–1.24)
Glucose, Bld: 76 mg/dL (ref 65–99)
Potassium: 4.7 mmol/L (ref 3.5–5.1)
SODIUM: 131 mmol/L — AB (ref 135–145)

## 2016-08-12 LAB — VALPROIC ACID LEVEL: VALPROIC ACID LVL: 71 ug/mL (ref 50.0–100.0)

## 2016-08-12 NOTE — ED Notes (Signed)
Waiting for ride from group home.

## 2016-08-12 NOTE — NC FL2 (Signed)
[place] MEDICAID FL2 LEVEL OF CARE SCREENING TOOL     IDENTIFICATION  Patient Name: Jeff Long Birthdate: 1960-05-24 Sex: male Admission Date (Current Location): 08/11/2016  [place] and [place] Number:  Haynes Bast [id number] R Facility and Address:  [facility],  [address] [place] [address]      Provider Number: [id number]  Attending Physician Name and Address:  Default, Provider, MD  Relative Name and Phone Number:       Current Level of Care: Hospital Recommended Level of Care:  [facility]) Prior Approval Number:    Date Approved/Denied:   PASRR Number: [id number]  Discharge Plan:  [facility])    Current Diagnoses: Patient Active Problem List   Diagnosis Date Noted  . Schizoaffective disorder, mixed type (HCC) 10/15/2014  . Hyponatremia 08/18/2014  . Anemia 08/18/2014  . Vitamin D deficiency 08/18/2014  . Tobacco abuse 08/18/2014    Orientation RESPIRATION BLADDER Height & Weight     Self  Normal Continent Weight: 142 lb (64.4 kg) Height:     BEHAVIORAL SYMPTOMS/MOOD NEUROLOGICAL BOWEL NUTRITION STATUS  Verbally abusive   Incontinent  (Regular)  AMBULATORY STATUS COMMUNICATION OF NEEDS Skin   Independent Verbally Normal                       Personal Care Assistance Level of Assistance              Functional Limitations Info             SPECIAL CARE FACTORS FREQUENCY                       Contractures Contractures Info: Not present    Additional Factors Info  Code Status, Allergies Code Status Info: Full Code Allergies Info: Lithium and Thorazine           Current Medications (08/12/2016):  This is the current hospital active medication list Current Facility-Administered Medications  Medication Dose Route Frequency Provider Last Rate Last Dose  . benztropine (COGENTIN) tablet 0.5 mg  0.5 mg Oral BID Rhunette Croft, Ankit, MD   0.5 mg at 08/12/16 1102  . divalproex (DEPAKOTE) DR tablet  500-1,000 mg  500-1,000 mg Oral BID Derwood Kaplan, MD   500 mg at 08/12/16 1103  . ferrous sulfate tablet 325 mg  325 mg Oral Daily Nanavati, Ankit, MD   325 mg at 08/12/16 1447  . FLUoxetine (PROZAC) capsule 20 mg  20 mg Oral Daily Nanavati, Ankit, MD   20 mg at 08/12/16 1103  . haloperidol (HALDOL) tablet 5 mg  5 mg Oral Q8H PRN Nanavati, Ankit, MD      . ibuprofen (ADVIL,MOTRIN) tablet 600 mg  600 mg Oral Q8H PRN Nanavati, Ankit, MD      . OLANZapine (ZYPREXA) tablet 30 mg  30 mg Oral QHS Nanavati, Ankit, MD   30 mg at 08/11/16 2114  . ondansetron (ZOFRAN) tablet 4 mg  4 mg Oral Q8H PRN Rhunette Croft, Ankit, MD      . traZODone (DESYREL) tablet 50 mg  50 mg Oral QHS Derwood Kaplan, MD   50 mg at 08/11/16 2114   Current Outpatient Prescriptions  Medication Sig Dispense Refill  . benztropine (COGENTIN) 0.5 MG tablet Take 1 tablet (0.5 mg total) by mouth 2 (two) times daily. 60 tablet 0  . cholecalciferol (VITAMIN D) 1000 UNITS tablet Take 1 tablet (1,000 Units total) by mouth daily. (Patient taking differently: Take 1,000 Units by  mouth every morning. )    . divalproex (DEPAKOTE) 500 MG DR tablet Take 500-1,000 mg by mouth 2 (two) times daily. 500 mg every morning and 1000 mg every night    . ferrous sulfate 325 (65 FE) MG tablet Take 1 tablet (325 mg total) by mouth daily.    Marland Kitchen FLUoxetine (PROZAC) 20 MG tablet Take 20 mg by mouth every morning.    . haloperidol (HALDOL) 5 MG tablet Take 1 tablet (5 mg total) by mouth every 8 (eight) hours as needed for agitation. (Patient taking differently: Take 5 mg by mouth 2 (two) times daily. ) 30 tablet 0  . OLANZapine (ZYPREXA) 20 MG tablet Take 1.5 tablets (30 mg total) by mouth at bedtime. (Patient taking differently: Take 20 mg by mouth at bedtime. )    . Paliperidone Palmitate (INVEGA TRINZA) 819 MG/2.625ML SUSP Inject 819 mg into the muscle every 3 (three) months.    . traZODone (DESYREL) 50 MG tablet Take 50 mg by mouth at bedtime.       Discharge  Medications: Please see discharge summary for a list of discharge medications.  Relevant Imaging Results:  Relevant Lab Results:   Additional Information [id number]  Dorothe Pea Jaysean [place], LCSWA

## 2016-08-12 NOTE — ED Notes (Signed)
Group home coming to pick up patient at 6pm.

## 2016-08-12 NOTE — Progress Notes (Signed)
CSW spoke to pt's [facility]'s director Mr. Wyvonnia Lora and informed him since group home did not give 30 days notice, does not have evidence of aggression warranting the immediate expulsion of the pt by showing charges pressed by the group home, did not give proper notice to the pt's legal guardian and did not give notice to the legal guardian that the pt had signed himself out two weeks prior to the being admitted to the ED, that Mr. Willeen Cass must pick pt up immediately, or the CSW will report "abandonment" to [facility] and to DSS Adult Protective Services.  Mr. Willeen Cass agreed to pick up the pt at 6pm and will call the CSW when he is in the immediate vicinity of the ED.    CSW will consult with the ED CN, EDP, RN, security and [employer] who will escort the pt out of the Recess B (ambulance entrance) and into the group home vehicle, with the agreement of the ED CN.  CSW will continue to follow for D/C assistance.  Jeff Long. Jeff Long, Jeff Long, LCAS Clinical Social Worker Ph: [phone]

## 2016-08-12 NOTE — Discharge Instructions (Signed)
-  Follow-up with a primary care doctor for repeat sodium check in 3-5 days
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
## 2016-08-12 NOTE — Progress Notes (Signed)
CSW contacted [facility] and spoke with staff member Jeff Long. Staff reported that patient signed a 2 week notice to be discharged from Dr Jeff Long and that his last day was 08/11/2016. CSW inquired about patient's legal guardian's knowledge and signature on 2 week notice form signed by patient. CSW requested that staff fax 2 week notice form, staff agreed to fax. Staff reported that patient's legal guardian did not need to sign the 2 week notice form because patient iniaited the discharge. Staff reported that patient's legal guardian needed to be contacted in regards to residency and transportation for patient. CSW will contact patient's mother.   Jeff Long, LCSWA [facility]  Clinical Social Worker [phone]

## 2016-08-12 NOTE — ED Provider Notes (Signed)
Assumed care this morning. Patient noted to have mild hyponatremia on initial CMP. He has a history of the same and I suspect is secondary to his psych meds. Repeat BMP shows improving sodium. Patient will need outpatient labs and 3-5 days for repeat.   Shaune Pollack, MD 08/12/16 [phone]

## 2016-08-12 NOTE — BH Assessment (Signed)
[facility] Assessment Progress Note  Per Thedore Mins, MD, this pt does not require psychiatric hospitalization at this time.  Pt presents under IVC initiated by his care provider, which Dr Jannifer Franklin has rescinded.  Pt is to be discharged from [facility], and is to return to [facility].  Social worker will be contacted to facilitate this.  Pt's nurse, Aram Beecham, has been notified.  Doylene Canning, MA Triage Specialist [phone]

## 2016-08-12 NOTE — Progress Notes (Signed)
CSW spoke to pt's group home at ph: [phone] and Tresa Endo the director stated he would arrive at [facility] at approx 7pm to pick up the pt.  Tresa Endo stated he wll call the CSW when nearing the vicinity of the ED.    EDP signed FL-2 and pt is ready for D/C.    Dorothe Pea. Diamantina Edinger, Theresia Majors, LCAS Clinical Social Worker Ph: [phone]

## 2016-08-12 NOTE — Progress Notes (Signed)
CSW, security, and [employer] escorted pt from ED TCU to the ambulance entrance and pt entered group home's vehicle and drove away at D/C.  Please reconsult if future social work needs arise.  CSW signing off.  Dorothe Pea. Rachael Ferrie, Theresia Majors, LCAS Clinical Social Worker Ph: [phone]

## 2016-08-12 NOTE — Consult Note (Signed)
[facility] Face-to-Face Psychiatry Consult   Reason for Consult:  Altercation at his group home with aggression Referring Physician:  EDP Patient Identification: Jeff Long MRN:  [id number] Principal Diagnosis: Schizoaffective disorder, mixed type [facility]) Diagnosis:   Patient Active Problem List   Diagnosis Date Noted  . Schizoaffective disorder, mixed type (HCC) [F25.0] 10/15/2014    Priority: High  . Hyponatremia [E87.1] 08/18/2014  . Anemia [D64.9] 08/18/2014  . Vitamin D deficiency [E55.9] 08/18/2014  . Tobacco abuse [Z72.0] 08/18/2014    Total Time spent with patient: 45 minutes  Subjective:   Jeff Long is a 56 y.o. male patient does not warrant admission.  HPI:  56 yo male who came to the ED after arguing with staff at this group home and becoming aggressive.  Today, he is calm and cooperative.  REports someone at the group home upset him and he does not like the group home but did like his recent admission at [facility] for similar issues.  No suicidal/homicidal ideations, hallucinations, and alcohol/drug abuse.  Stable to return to his group home.  Past Psychiatric History: schizoaffective disorder  Risk to Self: Suicidal Ideation: No Suicidal Intent: No Is patient at risk for suicide?: No Suicidal Plan?: No Access to Means: No What has been your use of drugs/alcohol within the last 12 months?: NA How many times?: 0 Other Self Harm Risks: NA Triggers for Past Attempts: None known Intentional Self Injurious Behavior: None Risk to Others: Homicidal Ideation: No Thoughts of Harm to Others: No Current Homicidal Intent: No Current Homicidal Plan: No Access to Homicidal Means: No Identified Victim: NA History of harm to others?: No Assessment of Violence: None Noted Violent Behavior Description: NA Does patient have access to weapons?: No Criminal Charges Pending?: No Does patient have a court date: No Prior Inpatient Therapy: Prior Inpatient Therapy: Yes Prior  Therapy Dates: unknown Prior Therapy Facilty/Provider(s): [facility] Reason for Treatment: schizophrenia Prior Outpatient Therapy: Prior Outpatient Therapy: Yes Prior Therapy Dates: current Prior Therapy Facilty/Provider(s):  Dr. Prudy Feeler Reason for Treatment: schizophrenia Does patient have an ACCT team?: No Does patient have Intensive In-House Services?  : No Does patient have [facility] services? : No Does patient have P4CC services?: No  Past Medical History:  Past Medical History:  Diagnosis Date  . Asthma   . Crohn's disease (HCC)   . Schizophrenia [facility])     Past Surgical History:  Procedure Laterality Date  . NO PAST SURGERIES     Family History: No family history on file. Family Psychiatric  History: unknown Social History:  History  Alcohol Use No     History  Drug Use No    Social History   Social History  . Marital status: Single    Spouse name: N/A  . Number of children: N/A  . Years of education: N/A   Social History Main Topics  . Smoking status: Current Every Day Smoker    Packs/day: 0.50  . Smokeless tobacco: None  . Alcohol use No  . Drug use: No  . Sexual activity: Not Asked   Other Topics Concern  . None   Social History Narrative   The patient is currently a resident at a group home. He has never been married and has no children. His sister and mother live in [place]. He has a 10th grade education and is on Disability since early 41s.   Additional Social History:    Allergies:   Allergies  Allergen Reactions  . Lithium  Other (See Comments)    Reaction:  Agitation    . Thorazine [Chlorpromazine] Other (See Comments)    Reaction:  Agitation     Labs:  Results for orders placed or performed during the hospital encounter of 08/11/16 (from the past 48 hour(s))  Comprehensive metabolic panel     Status: Abnormal   Collection Time: 08/11/16  4:30 AM  Result Value Ref Range   Sodium 128 (L) 135 - 145 mmol/L   Potassium 4.5 3.5  - 5.1 mmol/L   Chloride 96 (L) 101 - 111 mmol/L   CO2 26 22 - 32 mmol/L   Glucose, Bld 82 65 - 99 mg/dL   BUN 10 6 - 20 mg/dL   Creatinine, Ser 1.10 0.61 - 1.24 mg/dL   Calcium 10.7 (H) 8.9 - 10.3 mg/dL   Total Protein 7.8 6.5 - 8.1 g/dL   Albumin 3.3 (L) 3.5 - 5.0 g/dL   AST 13 (L) 15 - 41 U/L   ALT 8 (L) 17 - 63 U/L   Alkaline Phosphatase 69 38 - 126 U/L   Total Bilirubin 0.2 (L) 0.3 - 1.2 mg/dL   GFR calc non Af Amer >60 >60 mL/min   GFR calc Af Amer >60 >60 mL/min    Comment: (NOTE) The eGFR has been calculated using the CKD EPI equation. This calculation has not been validated in all clinical situations. eGFR's persistently <60 mL/min signify possible Chronic Kidney Disease.    Anion gap 6 5 - 15  Ethanol     Status: None   Collection Time: 08/11/16  4:30 AM  Result Value Ref Range   Alcohol, Ethyl (B) <5 <5 mg/dL    Comment:        LOWEST DETECTABLE LIMIT FOR SERUM ALCOHOL IS 5 mg/dL FOR MEDICAL PURPOSES ONLY   Salicylate level     Status: None   Collection Time: 08/11/16  4:30 AM  Result Value Ref Range   Salicylate Lvl <6.3 2.8 - 30.0 mg/dL  Acetaminophen level     Status: Abnormal   Collection Time: 08/11/16  4:30 AM  Result Value Ref Range   Acetaminophen (Tylenol), Serum <10 (L) 10 - 30 ug/mL    Comment:        THERAPEUTIC CONCENTRATIONS VARY SIGNIFICANTLY. A RANGE OF 10-30 ug/mL MAY BE AN EFFECTIVE CONCENTRATION FOR MANY PATIENTS. HOWEVER, SOME ARE BEST TREATED AT CONCENTRATIONS OUTSIDE THIS RANGE. ACETAMINOPHEN CONCENTRATIONS >150 ug/mL AT 4 HOURS AFTER INGESTION AND >50 ug/mL AT 12 HOURS AFTER INGESTION ARE OFTEN ASSOCIATED WITH TOXIC REACTIONS.   cbc     Status: Abnormal   Collection Time: 08/11/16  4:30 AM  Result Value Ref Range   WBC 6.7 4.0 - 10.5 K/uL   RBC 3.10 (L) 4.22 - 5.81 MIL/uL   Hemoglobin 9.9 (L) 13.0 - 17.0 g/dL   HCT 29.9 (L) 39.0 - 52.0 %   MCV 96.5 78.0 - 100.0 fL   MCH 31.9 26.0 - 34.0 pg   MCHC 33.1 30.0 - 36.0 g/dL    RDW 13.0 11.5 - 15.5 %   Platelets 374 150 - 400 K/uL  Rapid urine drug screen (hospital performed)     Status: None   Collection Time: 08/11/16  4:30 AM  Result Value Ref Range   Opiates NONE DETECTED NONE DETECTED   Cocaine NONE DETECTED NONE DETECTED   Benzodiazepines NONE DETECTED NONE DETECTED   Amphetamines NONE DETECTED NONE DETECTED   Tetrahydrocannabinol NONE DETECTED NONE DETECTED   Barbiturates NONE DETECTED  NONE DETECTED    Comment:        DRUG SCREEN FOR MEDICAL PURPOSES ONLY.  IF CONFIRMATION IS NEEDED FOR ANY PURPOSE, NOTIFY LAB WITHIN 5 DAYS.        LOWEST DETECTABLE LIMITS FOR URINE DRUG SCREEN Drug Class       Cutoff (ng/mL) Amphetamine      1000 Barbiturate      200 Benzodiazepine   734 Tricyclics       193 Opiates          300 Cocaine          300 THC              50   Valproic acid level     Status: None   Collection Time: 08/12/16  5:18 AM  Result Value Ref Range   Valproic Acid Lvl 71 50.0 - 100.0 ug/mL  Basic metabolic panel     Status: Abnormal   Collection Time: 08/12/16  5:18 AM  Result Value Ref Range   Sodium 131 (L) 135 - 145 mmol/L   Potassium 4.7 3.5 - 5.1 mmol/L   Chloride 99 (L) 101 - 111 mmol/L   CO2 26 22 - 32 mmol/L   Glucose, Bld 76 65 - 99 mg/dL   BUN 9 6 - 20 mg/dL   Creatinine, Ser 0.93 0.61 - 1.24 mg/dL   Calcium 10.3 8.9 - 10.3 mg/dL   GFR calc non Af Amer >60 >60 mL/min   GFR calc Af Amer >60 >60 mL/min    Comment: (NOTE) The eGFR has been calculated using the CKD EPI equation. This calculation has not been validated in all clinical situations. eGFR's persistently <60 mL/min signify possible Chronic Kidney Disease.    Anion gap 6 5 - 15    Current Facility-Administered Medications  Medication Dose Route Frequency Provider Last Rate Last Dose  . benztropine (COGENTIN) tablet 0.5 mg  0.5 mg Oral BID Varney Biles, MD   0.5 mg at 08/11/16 2114  . divalproex (DEPAKOTE) DR tablet 500-1,000 mg  500-1,000 mg Oral BID  Varney Biles, MD   500 mg at 08/11/16 2114  . ferrous sulfate tablet 325 mg  325 mg Oral Daily Nanavati, Ankit, MD   325 mg at 08/11/16 1253  . FLUoxetine (PROZAC) capsule 20 mg  20 mg Oral Daily Nanavati, Ankit, MD   20 mg at 08/11/16 1253  . haloperidol (HALDOL) tablet 5 mg  5 mg Oral Q8H PRN Nanavati, Ankit, MD      . ibuprofen (ADVIL,MOTRIN) tablet 600 mg  600 mg Oral Q8H PRN Nanavati, Ankit, MD      . OLANZapine (ZYPREXA) tablet 30 mg  30 mg Oral QHS Nanavati, Ankit, MD   30 mg at 08/11/16 2114  . ondansetron (ZOFRAN) tablet 4 mg  4 mg Oral Q8H PRN Kathrynn Humble, Ankit, MD      . traZODone (DESYREL) tablet 50 mg  50 mg Oral QHS Varney Biles, MD   50 mg at 08/11/16 2114   Current Outpatient Prescriptions  Medication Sig Dispense Refill  . benztropine (COGENTIN) 0.5 MG tablet Take 1 tablet (0.5 mg total) by mouth 2 (two) times daily. 60 tablet 0  . cholecalciferol (VITAMIN D) 1000 UNITS tablet Take 1 tablet (1,000 Units total) by mouth daily. (Patient taking differently: Take 1,000 Units by mouth every morning. )    . divalproex (DEPAKOTE) 500 MG DR tablet Take 500-1,000 mg by mouth 2 (two) times daily. 500 mg every morning and  1000 mg every night    . ferrous sulfate 325 (65 FE) MG tablet Take 1 tablet (325 mg total) by mouth daily.    Marland Kitchen FLUoxetine (PROZAC) 20 MG tablet Take 20 mg by mouth every morning.    . haloperidol (HALDOL) 5 MG tablet Take 1 tablet (5 mg total) by mouth every 8 (eight) hours as needed for agitation. (Patient taking differently: Take 5 mg by mouth 2 (two) times daily. ) 30 tablet 0  . OLANZapine (ZYPREXA) 20 MG tablet Take 1.5 tablets (30 mg total) by mouth at bedtime. (Patient taking differently: Take 20 mg by mouth at bedtime. )    . Paliperidone Palmitate (INVEGA TRINZA) 819 MG/2.625ML SUSP Inject 819 mg into the muscle every 3 (three) months.    . traZODone (DESYREL) 50 MG tablet Take 50 mg by mouth at bedtime.      Musculoskeletal: Strength & Muscle Tone:  within normal limits Gait & Station: normal Patient leans: N/A  Psychiatric Specialty Exam: Physical Exam  Constitutional: He is oriented to person, place, and time. He appears well-developed and well-nourished.  HENT:  Head: Normocephalic.  Neck: Normal range of motion.  Respiratory: Effort normal.  Musculoskeletal: Normal range of motion.  Neurological: He is alert and oriented to person, place, and time.  Psychiatric: He has a normal mood and affect. His speech is normal and behavior is normal. Judgment and thought content normal. Cognition and memory are normal.    Review of Systems  All other systems reviewed and are negative.   Blood pressure 94/64, pulse 78, temperature 98 F (36.7 C), temperature source Oral, resp. rate 16, weight 64.4 kg (142 lb), SpO2 94 %.Body mass index is 19.8 kg/m.  General Appearance: Casual  Eye Contact:  Good  Speech:  Normal Rate  Volume:  Normal  Mood:  Euthymic  Affect:  Congruent  Thought Process:  Coherent and Descriptions of Associations: Intact  Orientation:  Full (Time, Place, and Person)  Thought Content:  WDL and Logical  Suicidal Thoughts:  No  Homicidal Thoughts:  No  Memory:  Immediate;   Good Recent;   Good Remote;   Good  Judgement:  Fair  Insight:  Fair  Psychomotor Activity:  Normal  Concentration:  Concentration: Good and Attention Span: Good  Recall:  Good  Fund of Knowledge:  Fair  Language:  Good  Akathisia:  No  Handed:  Right  AIMS (if indicated):     Assets:  Housing Leisure Time Physical Health Resilience Social Support  ADL's:  Intact  Cognition:  WNL  Sleep:        Treatment Plan Summary: Daily contact with patient to assess and evaluate symptoms and progress in treatment, Medication management and Plan schizoaffective disorder, mixed type:  -Crisis stabilization -Medication management:  Continue medical medications along with Zyprexa 30 mg at bedtime for psychosis, Cogentin 0.5 mg BID for EPS,  Haldol 5 mg TID PRN agitation, Trazodone 50 mg at bedtime for sleep, and Depakote 500 mg in the am and 1000 mg in the pm for mood stabilization -Individual counseling  Disposition: No evidence of imminent risk to self or others at present.    Waylan Boga, NP 08/12/2016 10:53 AM  Patient seen face-to-face for psychiatric evaluation, chart reviewed and case discussed with the physician extender and developed treatment plan. Reviewed the information documented and agree with the treatment plan. Corena Pilgrim, MD

## 2016-08-12 NOTE — Progress Notes (Signed)
CSW contacted patient's legal guardian (Mother Michaeljames Milnes [phone]) and informed her that patient was psychiatrically cleared and ready for discharge. Patient's mother confirmed the plan is for patient to return back to group home. Patient's mother provided CSW with contact information for [facility] [phone]. CSW contacted [facility], no answer or option to leave voice mail. CSW will continue to try and reach [facility] to inform staff that patient is ready for discharge and requires transportation.   Celso Sickle, LCSWA [facility]  Clinical Social Worker [phone]

## 2016-10-30 ENCOUNTER — Emergency Department
Admission: EM | Admit: 2016-10-30 | Discharge: 2016-10-30 | Disposition: A | Discharge status: Home / Self Care | Payer: Medicaid Other | Attending: Emergency Medicine | Admitting: Emergency Medicine

## 2016-10-30 ENCOUNTER — Encounter: Payer: Self-pay | Admitting: Emergency Medicine

## 2016-10-30 DIAGNOSIS — Z5321 Procedure and treatment not carried out due to patient leaving prior to being seen by health care provider: Secondary | ICD-10-CM | POA: Diagnosis not present

## 2016-10-30 DIAGNOSIS — R197 Diarrhea, unspecified: Secondary | ICD-10-CM | POA: Diagnosis present

## 2016-10-30 NOTE — ED Notes (Signed)
Patient noted leaving with staff, alert, gait steady.

## 2016-10-30 NOTE — ED Triage Notes (Signed)
Pt in via POV with representative from [facility].  Per pt, diarrhea x approximately 2 weeks.  Facility is concerned for possible dehydration.  Pt denies any N/V or abdominal pain.  NAD noted at this time.

## 2016-10-30 NOTE — ED Notes (Signed)
Upon blood draw, pt states, "I don't do any blood work, I don't need a room, I have already been checked out, I am refusing treatment."  Pt is A/Ox4, ambulatory without difficulty.  Pt proceeds to walk out of triage room with representative from [facility].

## 2016-11-07 ENCOUNTER — Emergency Department: Payer: Medicaid Other

## 2016-11-07 ENCOUNTER — Emergency Department
Admission: EM | Admit: 2016-11-07 | Discharge: 2016-11-08 | Disposition: A | Discharge status: Home / Self Care | Payer: Medicaid Other | Attending: Emergency Medicine | Admitting: Emergency Medicine

## 2016-11-07 DIAGNOSIS — F17200 Nicotine dependence, unspecified, uncomplicated: Secondary | ICD-10-CM | POA: Insufficient documentation

## 2016-11-07 DIAGNOSIS — N289 Disorder of kidney and ureter, unspecified: Secondary | ICD-10-CM | POA: Insufficient documentation

## 2016-11-07 DIAGNOSIS — R197 Diarrhea, unspecified: Secondary | ICD-10-CM | POA: Insufficient documentation

## 2016-11-07 DIAGNOSIS — M25562 Pain in left knee: Secondary | ICD-10-CM | POA: Diagnosis not present

## 2016-11-07 DIAGNOSIS — A498 Other bacterial infections of unspecified site: Secondary | ICD-10-CM

## 2016-11-07 DIAGNOSIS — J45909 Unspecified asthma, uncomplicated: Secondary | ICD-10-CM | POA: Diagnosis not present

## 2016-11-07 DIAGNOSIS — Z79899 Other long term (current) drug therapy: Secondary | ICD-10-CM | POA: Insufficient documentation

## 2016-11-07 DIAGNOSIS — K5090 Crohn's disease, unspecified, without complications: Secondary | ICD-10-CM | POA: Diagnosis not present

## 2016-11-07 HISTORY — DX: Hyperlipidemia, unspecified: E78.5

## 2016-11-07 LAB — C DIFFICILE QUICK SCREEN W PCR REFLEX
C DIFFICLE (CDIFF) ANTIGEN: NEGATIVE
C Diff interpretation: NOT DETECTED
C Diff toxin: NEGATIVE

## 2016-11-07 LAB — URINALYSIS, COMPLETE (UACMP) WITH MICROSCOPIC
BACTERIA UA: NONE SEEN
BILIRUBIN URINE: NEGATIVE
Glucose, UA: NEGATIVE mg/dL
HGB URINE DIPSTICK: NEGATIVE
Ketones, ur: NEGATIVE mg/dL
LEUKOCYTES UA: NEGATIVE
Nitrite: NEGATIVE
PROTEIN: NEGATIVE mg/dL
SQUAMOUS EPITHELIAL / LPF: NONE SEEN
Specific Gravity, Urine: 1.023 (ref 1.005–1.030)
pH: 5 (ref 5.0–8.0)

## 2016-11-07 LAB — COMPREHENSIVE METABOLIC PANEL
ALBUMIN: 3.2 g/dL — AB (ref 3.5–5.0)
ALT: 7 U/L — ABNORMAL LOW (ref 17–63)
AST: 20 U/L (ref 15–41)
Alkaline Phosphatase: 64 U/L (ref 38–126)
Anion gap: 12 (ref 5–15)
BUN: 41 mg/dL — AB (ref 6–20)
CHLORIDE: 93 mmol/L — AB (ref 101–111)
CO2: 25 mmol/L (ref 22–32)
Calcium: 10.3 mg/dL (ref 8.9–10.3)
Creatinine, Ser: 1.98 mg/dL — ABNORMAL HIGH (ref 0.61–1.24)
GFR calc Af Amer: 42 mL/min — ABNORMAL LOW (ref >60)
GFR calc non Af Amer: 37 mL/min — ABNORMAL LOW (ref >60)
GLUCOSE: 108 mg/dL — AB (ref 65–99)
POTASSIUM: 4 mmol/L (ref 3.5–5.1)
SODIUM: 130 mmol/L — AB (ref 135–145)
Total Bilirubin: 1.5 mg/dL — ABNORMAL HIGH (ref 0.3–1.2)
Total Protein: 8.2 g/dL — ABNORMAL HIGH (ref 6.5–8.1)

## 2016-11-07 LAB — CBC
HEMATOCRIT: 30.7 % — AB (ref 40.0–52.0)
Hemoglobin: 10.4 g/dL — ABNORMAL LOW (ref 13.0–18.0)
MCH: 34.2 pg — AB (ref 26.0–34.0)
MCHC: 34 g/dL (ref 32.0–36.0)
MCV: 100.5 fL — AB (ref 80.0–100.0)
Platelets: 339 10*3/uL (ref 150–440)
RBC: 3.05 MIL/uL — ABNORMAL LOW (ref 4.40–5.90)
RDW: 14.4 % (ref 11.5–14.5)
WBC: 6.9 10*3/uL (ref 3.8–10.6)

## 2016-11-07 LAB — LIPASE, BLOOD: LIPASE: 23 U/L (ref 11–51)

## 2016-11-07 MED ORDER — SODIUM CHLORIDE 0.9 % IV BOLUS (SEPSIS)
1000.0000 mL | Freq: Once | INTRAVENOUS | Status: AC
  Administered 2016-11-07: 1000 mL via INTRAVENOUS

## 2016-11-07 MED ORDER — LOPERAMIDE HCL 2 MG PO CAPS
4.0000 mg | ORAL_CAPSULE | Freq: Once | ORAL | Status: AC
  Administered 2016-11-07: 4 mg via ORAL
  Filled 2016-11-07: qty 2

## 2016-11-07 MED ORDER — SODIUM CHLORIDE 0.9 % IV BOLUS (SEPSIS)
1000.0000 mL | Freq: Once | INTRAVENOUS | Status: AC
  Administered 2016-11-08: 1000 mL via INTRAVENOUS

## 2016-11-07 NOTE — ED Notes (Signed)
[facility] (caretaker) [phone] (home) [phone] (cell) *Call Ms. Marlette Renette Butters to come pick him up when discharged.

## 2016-11-07 NOTE — ED Provider Notes (Signed)
[facility] Emergency Department Provider Note  Time seen: 8:35 PM  I have reviewed the triage vital signs and the nursing notes.   HISTORY  Chief Complaint Diarrhea and Leg Pain    HPI Jeff Long is a 56 y.o. male with a past medical history of Crohn's, hyperlipidemia, schizophrenia, here with his group home caregiver presents to the emergency department for diarrhea and left knee pain. According to the patient for the past 3 or 4 days he has been experiencing loose stool 5 or 6 episodes per day. He also states he has been experiencing knee pain for the past 24 hours and his left knee. Denies any trauma. Patient denies any nausea or vomiting. Denies any abdominal pain or fever. States the stool is loose and light brown in color. Denies any blood. Patient states he has not taken any medications for the diarrhea.  Past Medical History:  Diagnosis Date  . Asthma   . Crohn's disease (HCC)   . Hyperlipidemia   . Schizophrenia [facility])     Patient Active Problem List   Diagnosis Date Noted  . Schizoaffective disorder, mixed type (HCC) 10/15/2014  . Hyponatremia 08/18/2014  . Anemia 08/18/2014  . Vitamin D deficiency 08/18/2014  . Tobacco abuse 08/18/2014    Past Surgical History:  Procedure Laterality Date  . NO PAST SURGERIES      Prior to Admission medications   Medication Sig Start Date End Date Taking? Authorizing Provider  benztropine (COGENTIN) 0.5 MG tablet Take 1 tablet (0.5 mg total) by mouth 2 (two) times daily. 10/16/14   Withrow, Everardo All, FNP  cholecalciferol (VITAMIN D) 1000 UNITS tablet Take 1 tablet (1,000 Units total) by mouth daily. Patient taking differently: Take 1,000 Units by mouth every morning.  10/16/14   Withrow, Everardo All, FNP  divalproex (DEPAKOTE) 500 MG DR tablet Take 500-1,000 mg by mouth 2 (two) times daily. 500 mg every morning and 1000 mg every night    [provider]  ferrous sulfate 325 (65 FE) MG tablet Take 1  tablet (325 mg total) by mouth daily. 10/16/14   Withrow, Everardo All, FNP  FLUoxetine (PROZAC) 20 MG tablet Take 20 mg by mouth every morning.    [provider]  haloperidol (HALDOL) 5 MG tablet Take 1 tablet (5 mg total) by mouth every 8 (eight) hours as needed for agitation. Patient taking differently: Take 5 mg by mouth 2 (two) times daily.  09/02/15   Linwood Dibbles, MD  OLANZapine (ZYPREXA) 20 MG tablet Take 1.5 tablets (30 mg total) by mouth at bedtime. Patient taking differently: Take 20 mg by mouth at bedtime.  10/16/14   Withrow, Everardo All, FNP  Paliperidone Palmitate (INVEGA TRINZA) 819 MG/2.625ML SUSP Inject 819 mg into the muscle every 3 (three) months.    [provider]  traZODone (DESYREL) 50 MG tablet Take 50 mg by mouth at bedtime.    [provider]    Allergies  Allergen Reactions  . Lithium Other (See Comments)    Reaction:  Agitation    . Thorazine [Chlorpromazine] Other (See Comments)    Reaction:  Agitation     No family history on file.  Social History Social History  Substance Use Topics  . Smoking status: Current Every Day Smoker    Packs/day: 0.50  . Smokeless tobacco: Never Used  . Alcohol use No    Review of Systems Constitutional: Negative for fever Cardiovascular: Negative for chest pain. Respiratory: Negative for shortness  of breath. Gastrointestinal: Negative for abdominal pain. Negative for nausea or vomiting. Positive for diarrhea 3 or 4 days per patient. Genitourinary: Negative for dysuria. Neurological: Negative for headache All other ROS negative  ____________________________________________   PHYSICAL EXAM:  VITAL SIGNS: ED Triage Vitals [11/07/16 2008]  Enc Vitals Group     BP (!) 94/54     Pulse Rate (!) 118     Resp 15     Temp 98.4 F (36.9 C)     Temp Source Oral     SpO2 100 %     Weight 140 lb (63.5 kg)     Height 6' (1.829 m)     Head Circumference      Peak Flow      Pain Score 3     Pain Loc       Pain Edu?      Excl. in GC?     Constitutional: Alert. Well appearing and in no distress. Eyes: Normal exam ENT   Head: Normocephalic and atraumatic   Mouth/Throat: Mucous membranes are moist. Cardiovascular: Normal rate, regular rhythm. No murmur Respiratory: Normal respiratory effort without tachypnea nor retractions. Breath sounds are clear  Gastrointestinal: Soft and nontender. No distention Musculoskeletal: Nontender with normal range of motion in all extremities Neurologic:  Normal speech and language. No gross focal neurologic deficits  Skin:  Skin is warm, dry and intact.  Psychiatric: Mood and affect are normal.   ____________________________________________  Knee x-ray negative  INITIAL IMPRESSION / ASSESSMENT AND PLAN / ED COURSE  Pertinent labs & imaging results that were available during my care of the patient were reviewed by me and considered in my medical decision making (see chart for details).  Patient presents to the emergency department with diarrhea for the past 4 days per patient. Patient has a documented history of Crohn's disease per record review but does not take any medications for this. Patient's labs show renal insufficiency, which appears to be new for the patient. Patient's hyponatremia is baseline. H&H is unchanged. We will place an IV, IV hydrate, treat with loperamide and continue to closely monitor.  Patient's labs show a negative C. difficile test. His GI panel is pending at this time. He is feeling much better after loperamide in the emergency department. He is receiving 2 L of fluid for his renal insufficiency. I discussed with the patient increasing his water intake at home and using loperamide as needed. At this time his white blood cell count is normal, he has a nontender abdomen making a Crohn's flare less likely. I discussed following up with GI medicine.  Stool panel pending, patient care signed out to Dr.  Dolores Frame.  ____________________________________________   FINAL CLINICAL IMPRESSION(S) / ED DIAGNOSES  Diarrhea  renal insufficiency    Minna Antis, MD 11/07/16 2310

## 2016-11-07 NOTE — Discharge Instructions (Addendum)
1. Take antibiotic as prescribed (Cipro 500 mg twice daily 5 days). 2. BRAT diet 3 days, then slowly advance diet as tolerated. 3. Return to the ER for worsening symptoms, persistent vomiting, difficulty breathing or other concerns.

## 2016-11-07 NOTE — ED Notes (Signed)
Pt has reported diarrhea and pt has Hx of Chrons diease. Family at bedside.

## 2016-11-07 NOTE — ED Triage Notes (Signed)
Patient c/o diarrhea X 3 days. Pt seen in this ED for same 2-3 weeks ago.  Patient c/o right knee pain. Pt reports he hit his knee on the wall.

## 2016-11-08 LAB — GASTROINTESTINAL PANEL BY PCR, STOOL (REPLACES STOOL CULTURE)
Adenovirus F40/41: NOT DETECTED
Astrovirus: NOT DETECTED
CRYPTOSPORIDIUM: NOT DETECTED
Campylobacter species: NOT DETECTED
Cyclospora cayetanensis: NOT DETECTED
E. coli O157: NOT DETECTED
ENTEROAGGREGATIVE E COLI (EAEC): NOT DETECTED
Entamoeba histolytica: NOT DETECTED
Enterotoxigenic E coli (ETEC): NOT DETECTED
GIARDIA LAMBLIA: NOT DETECTED
Norovirus GI/GII: NOT DETECTED
PLESIMONAS SHIGELLOIDES: NOT DETECTED
ROTAVIRUS A: NOT DETECTED
SALMONELLA SPECIES: NOT DETECTED
SAPOVIRUS (I, II, IV, AND V): NOT DETECTED
SHIGELLA/ENTEROINVASIVE E COLI (EIEC): NOT DETECTED
Shiga like toxin producing E coli (STEC): DETECTED — AB
Vibrio cholerae: NOT DETECTED
Vibrio species: NOT DETECTED
YERSINIA ENTEROCOLITICA: NOT DETECTED

## 2016-11-08 MED ORDER — CIPROFLOXACIN HCL 500 MG PO TABS: 500.0000 mg | ORAL_TABLET | Freq: Two times a day (BID) | ORAL | 0 refills | Status: DC

## 2016-11-08 MED ORDER — CIPROFLOXACIN HCL 500 MG PO TABS
500.0000 mg | ORAL_TABLET | Freq: Once | ORAL | Status: AC
  Administered 2016-11-08: 500 mg via ORAL
  Filled 2016-11-08: qty 1

## 2016-11-08 NOTE — ED Provider Notes (Signed)
-----------------------------------------   2:15 AM on 11/08/2016 -----------------------------------------  Patient's GI panel came back positive for STEC. Discussed with pharmacist; will place patient on a course of Cipro. Strict return precautions given. Patient verbalizes understanding and agrees with plan of care.   Irean Hong, MD 11/08/16 [phone]

## 2016-11-08 NOTE — ED Notes (Addendum)
Called [employer] from [facility] to come pick up pt. Jeff Long states she is on her way. Pt updated on discharge pickup

## 2016-11-08 NOTE — ED Notes (Signed)
Pt resting comfortably

## 2017-01-05 ENCOUNTER — Observation Stay
Admission: EM | Admit: 2017-01-05 | Discharge: 2017-01-07 | Disposition: A | Discharge status: Home / Self Care | Payer: Medicaid Other | Attending: Internal Medicine | Admitting: Internal Medicine

## 2017-01-05 ENCOUNTER — Encounter: Payer: Self-pay | Admitting: Emergency Medicine

## 2017-01-05 DIAGNOSIS — Z888 Allergy status to other drugs, medicaments and biological substances status: Secondary | ICD-10-CM | POA: Diagnosis not present

## 2017-01-05 DIAGNOSIS — Z23 Encounter for immunization: Secondary | ICD-10-CM | POA: Insufficient documentation

## 2017-01-05 DIAGNOSIS — F250 Schizoaffective disorder, bipolar type: Secondary | ICD-10-CM | POA: Diagnosis not present

## 2017-01-05 DIAGNOSIS — R9431 Abnormal electrocardiogram [ECG] [EKG]: Secondary | ICD-10-CM

## 2017-01-05 DIAGNOSIS — K5090 Crohn's disease, unspecified, without complications: Secondary | ICD-10-CM | POA: Diagnosis not present

## 2017-01-05 DIAGNOSIS — Z79899 Other long term (current) drug therapy: Secondary | ICD-10-CM | POA: Insufficient documentation

## 2017-01-05 DIAGNOSIS — E861 Hypovolemia: Secondary | ICD-10-CM | POA: Diagnosis not present

## 2017-01-05 DIAGNOSIS — J45909 Unspecified asthma, uncomplicated: Secondary | ICD-10-CM | POA: Insufficient documentation

## 2017-01-05 DIAGNOSIS — I959 Hypotension, unspecified: Secondary | ICD-10-CM | POA: Diagnosis not present

## 2017-01-05 DIAGNOSIS — E785 Hyperlipidemia, unspecified: Secondary | ICD-10-CM | POA: Insufficient documentation

## 2017-01-05 DIAGNOSIS — I4581 Long QT syndrome: Secondary | ICD-10-CM | POA: Diagnosis not present

## 2017-01-05 DIAGNOSIS — E559 Vitamin D deficiency, unspecified: Secondary | ICD-10-CM | POA: Insufficient documentation

## 2017-01-05 DIAGNOSIS — N179 Acute kidney failure, unspecified: Secondary | ICD-10-CM | POA: Diagnosis not present

## 2017-01-05 DIAGNOSIS — F17210 Nicotine dependence, cigarettes, uncomplicated: Secondary | ICD-10-CM | POA: Diagnosis not present

## 2017-01-05 DIAGNOSIS — I9589 Other hypotension: Secondary | ICD-10-CM

## 2017-01-05 DIAGNOSIS — E860 Dehydration: Secondary | ICD-10-CM | POA: Diagnosis present

## 2017-01-05 DIAGNOSIS — E43 Unspecified severe protein-calorie malnutrition: Secondary | ICD-10-CM | POA: Insufficient documentation

## 2017-01-05 DIAGNOSIS — R197 Diarrhea, unspecified: Principal | ICD-10-CM | POA: Insufficient documentation

## 2017-01-05 LAB — CBC WITH DIFFERENTIAL/PLATELET
BASOS ABS: 0 10*3/uL (ref 0–0.1)
Basophils Relative: 0 %
EOS PCT: 1 %
Eosinophils Absolute: 0 10*3/uL (ref 0–0.7)
HCT: 33.7 % — ABNORMAL LOW (ref 40.0–52.0)
HEMOGLOBIN: 11.2 g/dL — AB (ref 13.0–18.0)
LYMPHS ABS: 0.8 10*3/uL — AB (ref 1.0–3.6)
LYMPHS PCT: 13 %
MCH: 33.1 pg (ref 26.0–34.0)
MCHC: 33.4 g/dL (ref 32.0–36.0)
MCV: 99.2 fL (ref 80.0–100.0)
Monocytes Absolute: 1.4 10*3/uL — ABNORMAL HIGH (ref 0.2–1.0)
Monocytes Relative: 23 %
NEUTROS PCT: 63 %
Neutro Abs: 4 10*3/uL (ref 1.4–6.5)
PLATELETS: 271 10*3/uL (ref 150–440)
RBC: 3.4 MIL/uL — AB (ref 4.40–5.90)
RDW: 13.8 % (ref 11.5–14.5)
WBC: 6.2 10*3/uL (ref 3.8–10.6)

## 2017-01-05 LAB — COMPREHENSIVE METABOLIC PANEL
ALBUMIN: 3.2 g/dL — AB (ref 3.5–5.0)
ALT: 6 U/L — AB (ref 17–63)
AST: 12 U/L — AB (ref 15–41)
Alkaline Phosphatase: 63 U/L (ref 38–126)
Anion gap: 10 (ref 5–15)
BILIRUBIN TOTAL: 0.6 mg/dL (ref 0.3–1.2)
BUN: 29 mg/dL — AB (ref 6–20)
CHLORIDE: 95 mmol/L — AB (ref 101–111)
CO2: 23 mmol/L (ref 22–32)
Calcium: 11.8 mg/dL — ABNORMAL HIGH (ref 8.9–10.3)
Creatinine, Ser: 1.33 mg/dL — ABNORMAL HIGH (ref 0.61–1.24)
GFR calc Af Amer: >60 mL/min (ref >60)
GFR, EST NON AFRICAN AMERICAN: 59 mL/min — AB (ref >60)
Glucose, Bld: 108 mg/dL — ABNORMAL HIGH (ref 65–99)
POTASSIUM: 3.5 mmol/L (ref 3.5–5.1)
Sodium: 128 mmol/L — ABNORMAL LOW (ref 135–145)
TOTAL PROTEIN: 8.1 g/dL (ref 6.5–8.1)

## 2017-01-05 LAB — TROPONIN I

## 2017-01-05 LAB — MAGNESIUM: MAGNESIUM: 1.4 mg/dL — AB (ref 1.7–2.4)

## 2017-01-05 LAB — LACTIC ACID, PLASMA: LACTIC ACID, VENOUS: 1.2 mmol/L (ref 0.5–1.9)

## 2017-01-05 LAB — LIPASE, BLOOD: Lipase: 34 U/L (ref 11–51)

## 2017-01-05 MED ORDER — SODIUM CHLORIDE 0.9 % IV BOLUS (SEPSIS)
1000.0000 mL | Freq: Once | INTRAVENOUS | Status: AC
  Administered 2017-01-05: 1000 mL via INTRAVENOUS

## 2017-01-05 MED ORDER — MAGNESIUM SULFATE 2 GM/50ML IV SOLN
2.0000 g | Freq: Once | INTRAVENOUS | Status: AC
  Administered 2017-01-05: 2 g via INTRAVENOUS
  Filled 2017-01-05: qty 50

## 2017-01-05 MED ORDER — CIPROFLOXACIN HCL 500 MG PO TABS: 500.0000 mg | ORAL_TABLET | Freq: Once | ORAL | Status: DC

## 2017-01-05 MED ORDER — ONDANSETRON HCL 4 MG/2ML IJ SOLN: 4.0000 mg | Freq: Once | INTRAMUSCULAR | Status: DC

## 2017-01-05 NOTE — ED Notes (Signed)
Caregiver Marlette Renette Butters left and her numbers to call for ride are home #[phone] and cell #[phone].

## 2017-01-05 NOTE — ED Provider Notes (Signed)
[facility] Emergency Department Provider Note   ____________________________________________   First MD Initiated Contact with Patient 01/05/17 1940     (approximate)  I have reviewed the triage vital signs and the nursing notes.   HISTORY  Chief Complaint Diarrhea and Abdominal Pain    HPI Jeff Long is a 56 y.o. male here for evaluation of feeling lightheaded with standing, and having ongoing loose stools for about a week and a half.  Patient is here with his group home worker.  Both reports that he has been having loose stools and diarrhea.  Patient reports he is having 8-9 watery dark foul-smelling stools daily for about the last week and a half.  It was similar to an episode about a month ago where he was diagnosed with E. coli in his stool.  No vomiting, some mild nausea.  Today he is noticed he really feels very lightheaded whenever he tries to stand or walk.  No black or bloody stool.  Denies taking any blood thinners  No cough, no chest pain, no trouble breathing.  Abdominal pain.  Reports is just very loose stools.  Past Medical History:  Diagnosis Date  . Asthma   . Crohn's disease (HCC)   . Hyperlipidemia   . Schizophrenia [facility])     Patient Active Problem List   Diagnosis Date Noted  . Diarrhea 01/05/2017  . Dehydration 01/05/2017  . Schizoaffective disorder, mixed type (HCC) 10/15/2014  . Hyponatremia 08/18/2014  . Anemia 08/18/2014  . Vitamin D deficiency 08/18/2014  . Tobacco abuse 08/18/2014    Past Surgical History:  Procedure Laterality Date  . NO PAST SURGERIES      Prior to Admission medications   Medication Sig Start Date End Date Taking? Authorizing Provider  benztropine (COGENTIN) 0.5 MG tablet Take 1 tablet (0.5 mg total) by mouth 2 (two) times daily. 10/16/14   Withrow, Everardo All, FNP  cholecalciferol (VITAMIN D) 1000 UNITS tablet Take 1 tablet (1,000 Units total) by mouth daily. Patient taking differently: Take  1,000 Units by mouth every morning.  10/16/14   Withrow, Everardo All, FNP  ciprofloxacin (CIPRO) 500 MG tablet Take 1 tablet (500 mg total) by mouth 2 (two) times daily. 11/08/16   Irean Hong, MD  divalproex (DEPAKOTE) 500 MG DR tablet Take 500-1,000 mg by mouth 2 (two) times daily. 500 mg every morning and 1000 mg every night    [provider]  ferrous sulfate 325 (65 FE) MG tablet Take 1 tablet (325 mg total) by mouth daily. 10/16/14   Withrow, Everardo All, FNP  FLUoxetine (PROZAC) 20 MG tablet Take 20 mg by mouth every morning.    [provider]  haloperidol (HALDOL) 5 MG tablet Take 1 tablet (5 mg total) by mouth every 8 (eight) hours as needed for agitation. Patient taking differently: Take 5 mg by mouth 2 (two) times daily.  09/02/15   Linwood Dibbles, MD  OLANZapine (ZYPREXA) 20 MG tablet Take 1.5 tablets (30 mg total) by mouth at bedtime. Patient taking differently: Take 20 mg by mouth at bedtime.  10/16/14   Withrow, Everardo All, FNP  Paliperidone Palmitate (INVEGA TRINZA) 819 MG/2.625ML SUSP Inject 819 mg into the muscle every 3 (three) months.    [provider]  traZODone (DESYREL) 50 MG tablet Take 50 mg by mouth at bedtime.    [provider]    Allergies Lithium and Thorazine [chlorpromazine]  Family History  Problem Relation Age of Onset  .  Family history unknown: Yes    Social History Social History  Substance Use Topics  . Smoking status: Current Every Day Smoker    Packs/day: 0.50  . Smokeless tobacco: Never Used  . Alcohol use No    Review of Systems Constitutional: No fever/chills Eyes: No visual changes. ENT: No sore throat. Cardiovascular: Denies chest pain. Respiratory: Denies shortness of breath. Gastrointestinal: No abdominal pain. No vomiting.  No constipation. Genitourinary: Negative for dysuria. Musculoskeletal: Negative for back pain. Skin: Negative for rash. Neurological: Negative for headaches, focal weakness or  numbness.    ____________________________________________   PHYSICAL EXAM:  VITAL SIGNS: ED Triage Vitals  Enc Vitals Group     BP 01/05/17 1928 91/69     Pulse Rate 01/05/17 1928 (!) 132     Resp 01/05/17 1928 17     Temp 01/05/17 1928 (!) 97.5 F (36.4 C)     Temp Source 01/05/17 1928 Oral     SpO2 01/05/17 1928 98 %     Weight 01/05/17 1929 138 lb (62.6 kg)     Height 01/05/17 1929 5\' 11"  (1.803 m)     Head Circumference --      Peak Flow --      Pain Score 01/05/17 1927 4     Pain Loc --      Pain Edu? --      Excl. in GC? --     Constitutional: Alert and oriented.  Appears dehydrated and somewhat underweight.  He has a somewhat cachectic appearance but is in no distress and very pleasant. Eyes: Conjunctivae are normal. Head: Atraumatic. Nose: No congestion/rhinnorhea. Mouth/Throat: Mucous membranes are quite dry. Neck: No stridor.   Cardiovascular: Tachycardic rate, regular rhythm. Grossly normal heart sounds.  Good peripheral circulation. Respiratory: Normal respiratory effort.  No retractions. Lungs CTAB. Gastrointestinal: Soft and nontender. No distention.  No tenderness in any quadrant.  No rebound or guarding.  Slightly hyperactive bowel sounds.  Musculoskeletal: No lower extremity tenderness nor edema. Neurologic:  Normal speech and language. No gross focal neurologic deficits are appreciated.  Skin:  Skin is warm, dry and intact. No rash noted. Psychiatric: Mood and affect are normal. Speech and behavior are normal.  ____________________________________________   LABS (all labs ordered are listed, but only abnormal results are displayed)  Labs Reviewed  COMPREHENSIVE METABOLIC PANEL - Abnormal; Notable for the following:       Result Value   Sodium 128 (*)    Chloride 95 (*)    Glucose, Bld 108 (*)    BUN 29 (*)    Creatinine, Ser 1.33 (*)    Calcium 11.8 (*)    Albumin 3.2 (*)    AST 12 (*)    ALT 6 (*)    GFR calc non Af Amer 59 (*)    All  other components within normal limits  CBC WITH DIFFERENTIAL/PLATELET - Abnormal; Notable for the following:    RBC 3.40 (*)    Hemoglobin 11.2 (*)    HCT 33.7 (*)    Lymphs Abs 0.8 (*)    Monocytes Absolute 1.4 (*)    All other components within normal limits  MAGNESIUM - Abnormal; Notable for the following:    Magnesium 1.4 (*)    All other components within normal limits  CULTURE, BLOOD (ROUTINE X 2)  CULTURE, BLOOD (ROUTINE X 2)  GASTROINTESTINAL PANEL BY PCR, STOOL (REPLACES STOOL CULTURE)  C DIFFICILE QUICK SCREEN W PCR REFLEX  LACTIC ACID, PLASMA  LIPASE, BLOOD  TROPONIN I  URINALYSIS, ROUTINE W REFLEX MICROSCOPIC  LACTIC ACID, PLASMA  OCCULT BLOOD X 1 CARD TO LAB, STOOL   ____________________________________________  EKG  Reviewed and interpreted by me at 1930 Ventricular rate 130 QRS 76 QTC 470 Sinus tachycardia, nonspecific T wave abnormalities seen, there is a slight slurring appearance to the QT interval in multiple places, appears prolonged QT is present ____________________________________________  RADIOLOGY  No noted indication for CT at this time.  Denies abdominal pain ____________________________________________   PROCEDURES  Procedure(s) performed: None  Procedures  Critical Care performed: No  ____________________________________________   INITIAL IMPRESSION / ASSESSMENT AND PLAN / ED COURSE  Pertinent labs & imaging results that were available during my care of the patient were reviewed by me and considered in my medical decision making (see chart for details).  Patient presents for loose watery stools for the last 1-2 weeks, tachycardic and hypotensive, and appears clinically dehydrated.  Denies abdominal pain but does have a history of recent E. coli about a month and 1/2-2 months ago treated here in the ER which she reports was similar.  No signs or symptoms suggest peritonitis or an acute abdomen.  He denies any bleeding.  I suspect  he is likely suffering from some type of viral colitis or potentially recurrent bacterial colitis.  We will hydrate him generously and continue to monitor him closely.  ----------------------------------------- 10:01 PM on 01/05/2017 -----------------------------------------  We will admit the patient at this point, he reports no pain and is resting but he remains mildly hypotensive with blood pressure 97 systolic.  He is awake and alert, will write for an additional liter.  He does have low sodium but this appears to be fairly chronic.  Discussed case with Dr. Anne Hahn the hospitalist, he recommends that we wait on antibiotics as this may be a viral illness and would prefer to have a stool sample to the [facility] care and treatment.  Think is reasonable, the patient is agreeable with plan and does report and appears to be improving.  QT also prolonged, will withhold QT prolonging medications and replete his magnesium.      ____________________________________________   FINAL CLINICAL IMPRESSION(S) / ED DIAGNOSES  Final diagnoses:  Diarrhea of presumed infectious origin  Hypomagnesemia  Acute dehydration  Hypotension due to hypovolemia  Prolonged Q-T interval on ECG      NEW MEDICATIONS STARTED DURING THIS VISIT:  New Prescriptions   No medications on file     Note:  This document was prepared using Dragon voice recognition software and may include unintentional dictation errors.     Sharyn Creamer, MD 01/05/17 2203

## 2017-01-05 NOTE — H&P (Signed)
[facility] [place] at [facility]   PATIENT NAME: Jeff Long    MR#:  [id number]  DATE OF BIRTH:  10/22/1960  DATE OF ADMISSION:  01/05/2017  PRIMARY CARE PHYSICIAN: Pa, [facility]   REQUESTING/REFERRING PHYSICIAN: Fanny Bien, MD  CHIEF COMPLAINT:   Chief Complaint  Patient presents with  . Diarrhea  . Abdominal Pain    HISTORY OF PRESENT ILLNESS:  Jeff Long  is a 56 y.o. male who presents with abdominal days of diarrhea. Patient states that he's been having some abdominal pain and frequent episodes of diarrhea, up to 7 or 8 times a day. He does not give much more detail than this. He was treated here recently for Shiga toxin Escherichia coli. He is fairly dehydrated today, and hospitalists were called for admission and further evaluation  PAST MEDICAL HISTORY:   Past Medical History:  Diagnosis Date  . Asthma   . Crohn's disease (HCC)   . Hyperlipidemia   . Schizophrenia (HCC)     PAST SURGICAL HISTORY:   Past Surgical History:  Procedure Laterality Date  . NO PAST SURGERIES      SOCIAL HISTORY:   Social History  Substance Use Topics  . Smoking status: Current Every Day Smoker    Packs/day: 0.50  . Smokeless tobacco: Never Used  . Alcohol use No    FAMILY HISTORY:   Family History  Problem Relation Age of Onset  . Family history unknown: Yes    DRUG ALLERGIES:   Allergies  Allergen Reactions  . Lithium Other (See Comments)    Reaction:  Agitation    . Thorazine [Chlorpromazine] Other (See Comments)    Reaction:  Agitation     MEDICATIONS AT HOME:   Prior to Admission medications   Medication Sig Start Date End Date Taking? Authorizing Provider  benztropine (COGENTIN) 0.5 MG tablet Take 1 tablet (0.5 mg total) by mouth 2 (two) times daily. 10/16/14   Withrow, Everardo All, FNP  cholecalciferol (VITAMIN D) 1000 UNITS tablet Take 1 tablet (1,000 Units total) by mouth daily. Patient taking differently: Take 1,000 Units by  mouth every morning.  10/16/14   Withrow, Everardo All, FNP  ciprofloxacin (CIPRO) 500 MG tablet Take 1 tablet (500 mg total) by mouth 2 (two) times daily. 11/08/16   Irean Hong, MD  divalproex (DEPAKOTE) 500 MG DR tablet Take 500-1,000 mg by mouth 2 (two) times daily. 500 mg every morning and 1000 mg every night    [provider]  ferrous sulfate 325 (65 FE) MG tablet Take 1 tablet (325 mg total) by mouth daily. 10/16/14   Withrow, Everardo All, FNP  FLUoxetine (PROZAC) 20 MG tablet Take 20 mg by mouth every morning.    [provider]  haloperidol (HALDOL) 5 MG tablet Take 1 tablet (5 mg total) by mouth every 8 (eight) hours as needed for agitation. Patient taking differently: Take 5 mg by mouth 2 (two) times daily.  09/02/15   Linwood Dibbles, MD  OLANZapine (ZYPREXA) 20 MG tablet Take 1.5 tablets (30 mg total) by mouth at bedtime. Patient taking differently: Take 20 mg by mouth at bedtime.  10/16/14   Withrow, Everardo All, FNP  Paliperidone Palmitate (INVEGA TRINZA) 819 MG/2.625ML SUSP Inject 819 mg into the muscle every 3 (three) months.    [provider]  traZODone (DESYREL) 50 MG tablet Take 50 mg by mouth at bedtime.    [provider]    REVIEW OF SYSTEMS:  Review  of Systems  Constitutional: Negative for chills, fever, malaise/fatigue and weight loss.  HENT: Negative for ear pain, hearing loss and tinnitus.   Eyes: Negative for blurred vision, double vision, pain and redness.  Respiratory: Negative for cough, hemoptysis and shortness of breath.   Cardiovascular: Negative for chest pain, palpitations, orthopnea and leg swelling.  Gastrointestinal: Positive for abdominal pain and diarrhea. Negative for constipation, nausea and vomiting.  Genitourinary: Negative for dysuria, frequency and hematuria.  Musculoskeletal: Negative for back pain, joint pain and neck pain.  Skin:       No acne, rash, or lesions  Neurological: Negative for dizziness, tremors, focal weakness and  weakness.  Endo/Heme/Allergies: Negative for polydipsia. Does not bruise/bleed easily.  Psychiatric/Behavioral: Negative for depression. The patient is not nervous/anxious and does not have insomnia.      VITAL SIGNS:   Vitals:   01/05/17 1928 01/05/17 1929 01/05/17 2130  BP: 91/69  97/64  Pulse: (!) 132    Resp: 17  19  Temp: (!) 97.5 F (36.4 C)    TempSrc: Oral    SpO2: 98%    Weight:  62.6 kg (138 lb)   Height:  5\' 11"  (1.803 m)    Wt Readings from Last 3 Encounters:  01/05/17 62.6 kg (138 lb)  11/07/16 63.5 kg (140 lb)  10/30/16 63.5 kg (140 lb)    PHYSICAL EXAMINATION:  Physical Exam  Vitals reviewed. Constitutional: He appears well-developed and well-nourished. No distress.  HENT:  Head: Normocephalic and atraumatic.  Mouth/Throat: Oropharynx is clear and moist.  Eyes: Pupils are equal, round, and reactive to light. Conjunctivae and EOM are normal. No scleral icterus.  Neck: Normal range of motion. Neck supple. No JVD present. No thyromegaly present.  Cardiovascular: Normal rate, regular rhythm and intact distal pulses.  Exam reveals no gallop and no friction rub.   No murmur heard. Respiratory: Effort normal and breath sounds normal. No respiratory distress. He has no wheezes. He has no rales.  GI: Soft. Bowel sounds are normal. He exhibits no distension. There is tenderness.  Musculoskeletal: Normal range of motion. He exhibits no edema.  No arthritis, no gout  Lymphadenopathy:    He has no cervical adenopathy.  Neurological: He is alert. No cranial nerve deficit.  No dysarthria, no aphasia  Skin: Skin is warm and dry. No rash noted. No erythema.  Psychiatric:  Patient has somewhat flat affect at baseline, difficult to fully assess    LABORATORY PANEL:   CBC  Recent Labs Lab 01/05/17 1955  WBC 6.2  HGB 11.2*  HCT 33.7*  PLT 271    ------------------------------------------------------------------------------------------------------------------  Chemistries   Recent Labs Lab 01/05/17 1955  NA 128*  K 3.5  CL 95*  CO2 23  GLUCOSE 108*  BUN 29*  CREATININE 1.33*  CALCIUM 11.8*  MG 1.4*  AST 12*  ALT 6*  ALKPHOS 63  BILITOT 0.6   ------------------------------------------------------------------------------------------------------------------  Cardiac Enzymes  Recent Labs Lab 01/05/17 1955  TROPONINI <0.03   ------------------------------------------------------------------------------------------------------------------  RADIOLOGY:  No results found.  EKG:   Orders placed or performed during the hospital encounter of 01/05/17  . EKG 12-Lead  . EKG 12-Lead  . ED EKG 12-Lead  . ED EKG 12-Lead    IMPRESSION AND PLAN:  Principal Problem:   AKI (acute kidney injury) (HCC) - likely prerenal insult due to dehydration from his large point diarrhea. We will hydrate him with IV fluids tonight, avoid nephrotoxins, monitor for expected improvement Active Problems:   Diarrhea -  GI panel and C. difficile studies pending.   Dehydration - IV fluids as above   Schizoaffective disorder, mixed type (HCC) - continue home meds  All the records are reviewed and case discussed with ED provider. Management plans discussed with the patient and/or family.  DVT PROPHYLAXIS: SubQ lovenox  GI PROPHYLAXIS: None  ADMISSION STATUS: Observation  CODE STATUS: Full Code Status History    Date Active Date Inactive Code Status Order ID Comments User Context   08/11/2016  6:42 AM 08/12/2016 10:17 PM Full Code [id number]  Derwood Kaplan, MD ED   10/14/2014  8:30 PM 10/16/2014  9:37 PM Full Code [id number]  Rolan Bucco, MD ED   08/19/2014 10:24 PM 08/23/2014  7:55 PM Full Code [id number]  Audery Amel, MD Inpatient   08/18/2014 10:10 PM 08/19/2014  5:48 PM Full Code [id number]  Katharina Caper, MD Inpatient   11/07/2011   8:34 PM 11/09/2011  7:05 PM Full Code [id number]  Cheri Guppy, MD ED      TOTAL TIME TAKING CARE OF THIS PATIENT: 40 minutes.   Clairessa Boulet FIELDING 01/05/2017, 10:40 PM  [employer]  [phone]  CC: Primary care physician; Pa, [facility]  Note:  This document was prepared using Dragon voice recognition software and may include unintentional dictation errors.

## 2017-01-05 NOTE — ED Notes (Signed)
Pt transported to room 217. 

## 2017-01-05 NOTE — ED Triage Notes (Signed)
Pt from local group home, [facility], with caregiver with c/o diarrhea for 8-9 days; 5-6 episodes of watery diarrhea; pt reports dizziness at times with standing; caregiver says pt is not eating normally; was here several weeks ago with similar symptoms and diagnosed with E coli in his stool; pt awake and alert; hypertensive and tachycardic in triage;

## 2017-01-06 LAB — BASIC METABOLIC PANEL
ANION GAP: 7 (ref 5–15)
BUN: 22 mg/dL — AB (ref 6–20)
CALCIUM: 10.1 mg/dL (ref 8.9–10.3)
CO2: 21 mmol/L — ABNORMAL LOW (ref 22–32)
Chloride: 103 mmol/L (ref 101–111)
Creatinine, Ser: 0.96 mg/dL (ref 0.61–1.24)
GFR calc Af Amer: >60 mL/min (ref >60)
GLUCOSE: 95 mg/dL (ref 65–99)
Potassium: 3.1 mmol/L — ABNORMAL LOW (ref 3.5–5.1)
SODIUM: 131 mmol/L — AB (ref 135–145)

## 2017-01-06 LAB — GASTROINTESTINAL PANEL BY PCR, STOOL (REPLACES STOOL CULTURE)

## 2017-01-06 LAB — CBC
HCT: 25.6 % — ABNORMAL LOW (ref 40.0–52.0)
HEMOGLOBIN: 8.5 g/dL — AB (ref 13.0–18.0)
MCH: 33.2 pg (ref 26.0–34.0)
MCHC: 33.4 g/dL (ref 32.0–36.0)
MCV: 99.3 fL (ref 80.0–100.0)
Platelets: 225 10*3/uL (ref 150–440)
RBC: 2.57 MIL/uL — ABNORMAL LOW (ref 4.40–5.90)
RDW: 13.9 % (ref 11.5–14.5)
WBC: 5.7 10*3/uL (ref 3.8–10.6)

## 2017-01-06 LAB — C DIFFICILE QUICK SCREEN W PCR REFLEX
C DIFFICILE (CDIFF) TOXIN: NEGATIVE
C Diff antigen: NEGATIVE
C Diff interpretation: NOT DETECTED

## 2017-01-06 LAB — MAGNESIUM: MAGNESIUM: 2.1 mg/dL (ref 1.7–2.4)

## 2017-01-06 LAB — MRSA PCR SCREENING: MRSA BY PCR: NEGATIVE

## 2017-01-06 MED ORDER — OLANZAPINE 10 MG PO TABS
20.0000 mg | ORAL_TABLET | Freq: Every day | ORAL | Status: DC
  Administered 2017-01-06 (×2): 20 mg via ORAL
  Filled 2017-01-06 (×3): qty 2

## 2017-01-06 MED ORDER — DIVALPROEX SODIUM ER 500 MG PO TB24
1000.0000 mg | ORAL_TABLET | Freq: Every day | ORAL | Status: DC
  Administered 2017-01-06 (×2): 1000 mg via ORAL
  Filled 2017-01-06 (×3): qty 2

## 2017-01-06 MED ORDER — BENZTROPINE MESYLATE 0.5 MG PO TABS
0.5000 mg | ORAL_TABLET | Freq: Two times a day (BID) | ORAL | Status: DC
  Administered 2017-01-06 – 2017-01-07 (×4): 0.5 mg via ORAL
  Filled 2017-01-06 (×5): qty 1

## 2017-01-06 MED ORDER — SODIUM CHLORIDE 0.9 % IV SOLN
INTRAVENOUS | Status: DC
  Administered 2017-01-06: 19:00:00 via INTRAVENOUS

## 2017-01-06 MED ORDER — ACETAMINOPHEN 650 MG RE SUPP: 650.0000 mg | Freq: Four times a day (QID) | RECTAL | Status: DC | PRN

## 2017-01-06 MED ORDER — POTASSIUM CHLORIDE CRYS ER 20 MEQ PO TBCR
20.0000 meq | EXTENDED_RELEASE_TABLET | Freq: Once | ORAL | Status: AC
Start: 2017-01-06 — End: 2017-01-06
  Administered 2017-01-06: 20 meq via ORAL
  Filled 2017-01-06: qty 1

## 2017-01-06 MED ORDER — PROCHLORPERAZINE EDISYLATE 5 MG/ML IJ SOLN
5.0000 mg | INTRAMUSCULAR | Status: DC | PRN
  Filled 2017-01-06: qty 1

## 2017-01-06 MED ORDER — ENSURE ENLIVE PO LIQD
237.0000 mL | Freq: Two times a day (BID) | ORAL | Status: DC
  Administered 2017-01-06 – 2017-01-07 (×3): 237 mL via ORAL

## 2017-01-06 MED ORDER — DIVALPROEX SODIUM ER 500 MG PO TB24
500.0000 mg | ORAL_TABLET | Freq: Every morning | ORAL | Status: DC
  Administered 2017-01-06 – 2017-01-07 (×2): 500 mg via ORAL
  Filled 2017-01-06 (×2): qty 1

## 2017-01-06 MED ORDER — ADULT MULTIVITAMIN W/MINERALS CH
1.0000 | ORAL_TABLET | Freq: Every day | ORAL | Status: DC
  Administered 2017-01-06 – 2017-01-07 (×2): 1 via ORAL
  Filled 2017-01-06 (×2): qty 1

## 2017-01-06 MED ORDER — ACETAMINOPHEN 325 MG PO TABS: 650.0000 mg | ORAL_TABLET | Freq: Four times a day (QID) | ORAL | Status: DC | PRN

## 2017-01-06 MED ORDER — HALOPERIDOL 5 MG PO TABS
5.0000 mg | ORAL_TABLET | Freq: Two times a day (BID) | ORAL | Status: DC
  Administered 2017-01-06 – 2017-01-07 (×4): 5 mg via ORAL
  Filled 2017-01-06 (×5): qty 1

## 2017-01-06 MED ORDER — FLUOXETINE HCL 20 MG PO CAPS
20.0000 mg | ORAL_CAPSULE | ORAL | Status: DC
  Administered 2017-01-06 – 2017-01-07 (×2): 20 mg via ORAL
  Filled 2017-01-06 (×2): qty 1

## 2017-01-06 MED ORDER — ENOXAPARIN SODIUM 40 MG/0.4ML ~~LOC~~ SOLN
40.0000 mg | SUBCUTANEOUS | Status: DC
  Administered 2017-01-06: 40 mg via SUBCUTANEOUS
  Filled 2017-01-06: qty 0.4

## 2017-01-06 MED ORDER — MAGNESIUM SULFATE 2 GM/50ML IV SOLN
2.0000 g | Freq: Once | INTRAVENOUS | Status: AC
  Administered 2017-01-06: 2 g via INTRAVENOUS
  Filled 2017-01-06: qty 50

## 2017-01-06 MED ORDER — SODIUM CHLORIDE 0.9 % IV SOLN
INTRAVENOUS | Status: AC
  Administered 2017-01-06 (×2): via INTRAVENOUS

## 2017-01-06 MED ORDER — INFLUENZA VAC SPLIT QUAD 0.5 ML IM SUSY
0.5000 mL | PREFILLED_SYRINGE | INTRAMUSCULAR | Status: AC
  Administered 2017-01-07: 0.5 mL via INTRAMUSCULAR
  Filled 2017-01-06: qty 0.5

## 2017-01-06 NOTE — Clinical Social Work Note (Signed)
Clinical Social Work Assessment  Patient Details  Name: Jeff Long MRN: [id number] Date of Birth: 1960/11/10  Date of referral:  01/06/17               Reason for consult:  Facility Placement                Permission sought to share information with:  Facility Industrial/product designer granted to share information::  Yes, Verbal Permission Granted  Name::        Agency::     Relationship::     Contact Information:     Housing/Transportation Living arrangements for the past 2 months:  Group Home Source of Information:  Facility, Patient Patient Interpreter Needed:  None Criminal Activity/Legal Involvement Pertinent to Current Situation/Hospitalization:  No - Comment as needed Significant Relationships:  Friend Lives with:  Facility Resident Do you feel safe going back to the place where you live?  Yes Need for family participation in patient care:  No (Coment)  Care giving concerns:  Patient is a long term resident at [facility] group home.   Social Worker assessment / plan:  CSW spoke with owner of group home: [place], today in patient's room and she stated that patient can return when time to her group home. She has no concerns at this time. She and two of the group home residents are visiting with patient this afternoon.  Employment status:  Disabled (Comment on whether or not currently receiving Disability) Insurance information:  Medicaid [place] PT Recommendations:    Information / Referral to community resources:     Patient/Family's Response to care:  Patient pleasant but quiet.  Patient/Family's Understanding of and Emotional Response to Diagnosis, Current Treatment, and Prognosis:  Unsure of patient's understanding due to him not really speaking during conversation with group home owner.   Emotional Assessment Appearance:  Appears stated age Attitude/Demeanor/Rapport:   (pleasant but quiet) Affect (typically observed):  Calm Orientation:   Oriented to Self, Oriented to Place Alcohol / Substance use:  Not Applicable Psych involvement (Current and /or in the community):  No (Comment)  Discharge Needs  Concerns to be addressed:  Care Coordination Readmission within the last 30 days:  No Current discharge risk:  None Barriers to Discharge:  No Barriers Identified   York Spaniel, LCSW 01/06/2017, 2:27 PM

## 2017-01-06 NOTE — Progress Notes (Addendum)
[facility] - [place] at [facility]   PATIENT NAME: Jeff Long    MR#:  [id number]  DATE OF BIRTH:  1961/02/09  SUBJECTIVE:  Very sleepy Did not want to eat BF today Some diarrhea  REVIEW OF SYSTEMS:   Review of Systems  Constitutional: Negative for chills, fever and weight loss.  HENT: Negative for ear discharge, ear pain and nosebleeds.   Eyes: Negative for blurred vision, pain and discharge.  Respiratory: Negative for sputum production, shortness of breath, wheezing and stridor.   Cardiovascular: Negative for chest pain, palpitations, orthopnea and PND.  Gastrointestinal: Positive for diarrhea. Negative for abdominal pain, nausea and vomiting.  Genitourinary: Negative for frequency and urgency.  Musculoskeletal: Negative for back pain and joint pain.  Neurological: Positive for weakness. Negative for sensory change, speech change and focal weakness.  Psychiatric/Behavioral: Negative for depression and hallucinations. The patient is not nervous/anxious.    Tolerating PT: pending  DRUG ALLERGIES:   Allergies  Allergen Reactions  . Lithium Other (See Comments)    Reaction:  Agitation    . Thorazine [Chlorpromazine] Other (See Comments)    Reaction:  Agitation     VITALS:  Blood pressure 96/61, pulse 82, temperature 97.9 F (36.6 C), temperature source Oral, resp. rate 15, height 5\' 11"  (1.803 m), weight 57.4 kg (126 lb 9.6 oz), SpO2 98 %.  PHYSICAL EXAMINATION:   Physical Exam  GENERAL:  56 y.o.-year-old patient lying in the bed with no acute distress. weak EYES: Pupils equal, round, reactive to light and accommodation. No scleral icterus. Extraocular muscles intact.  HEENT: Head atraumatic, normocephalic. Oropharynx and nasopharynx clear.  NECK:  Supple, no jugular venous distention. No thyroid enlargement, no tenderness.  LUNGS: Normal breath sounds bilaterally, no wheezing, rales, rhonchi. No use of accessory muscles of respiration.   CARDIOVASCULAR: S1, S2 normal. No murmurs, rubs, or gallops.  ABDOMEN: Soft, nontender, nondistended. Bowel sounds present. No organomegaly or mass.  EXTREMITIES: No cyanosis, clubbing or edema b/l.    NEUROLOGIC: Cranial nerves II through XII are intact. No focal Motor or sensory deficits b/l.   PSYCHIATRIC:  patient is alert and oriented x 3.  SKIN: No obvious rash, lesion, or ulcer.   LABORATORY PANEL:  CBC  Recent Labs Lab 01/06/17 0336  WBC 5.7  HGB 8.5*  HCT 25.6*  PLT 225    Chemistries   Recent Labs Lab 01/05/17 1955 01/06/17 0336  NA 128* 131*  K 3.5 3.1*  CL 95* 103  CO2 23 21*  GLUCOSE 108* 95  BUN 29* 22*  CREATININE 1.33* 0.96  CALCIUM 11.8* 10.1  MG 1.4* 2.1  AST 12*  --   ALT 6*  --   ALKPHOS 63  --   BILITOT 0.6  --    Cardiac Enzymes  Recent Labs Lab 01/05/17 1955  TROPONINI <0.03   RADIOLOGY:  No results found. ASSESSMENT AND PLAN:  Jeff Long  is a 56 y.o. male who presents with abdominal days of diarrhea. Patient states that he's been having some abdominal pain and frequent episodes of diarrhea, up to 7 or 8 times a day.  * AKI (acute kidney injury) (HCC) - likely prerenal insult due to dehydration from his large point diarrhea.  -cont to  hydrate him with IV fluids tonight, avoid nephrotoxins, monitor for expected improvement -creat 1.33--0.96  *  Diarrhea - GI panel and C. difficile studies negative  *  Dehydration - IV fluids as above  *  Schizoaffective disorder,  mixed type [facility](HCC) - continue home meds  If improves will d/c tomorrow  Case discussed with Care Management/Social Worker. Management plans discussed with the patient, family and they are in agreement.  CODE STATUS: full  DVT Prophylaxis:lovenox  TOTAL TIME TAKING CARE OF THIS PATIENT: *30* minutes.  >50% time spent on counselling and coordination of care  POSSIBLE D/C IN 1 -2 DAYS, DEPENDING ON CLINICAL CONDITION.  Note: This dictation was prepared with  Dragon dictation along with smaller phrase technology. Any transcriptional errors that result from this process are unintentional.  Jani Ploeger M.D on 01/06/2017 at 2:33 PM  Between 7am to 6pm - Pager - [phone]  After 6pm go to [email or web address] - Social research officer, governmentpassword EPAS [facility]  [facility]  Office  [phone]  CC: Primary care physician; Pa, [facility]

## 2017-01-06 NOTE — Progress Notes (Signed)
Initial Nutrition Assessment  DOCUMENTATION CODES:   Severe malnutrition in context of acute illness/injury  INTERVENTION:   Ensure Enlive po BID, each supplement provides 350 kcal and 20 grams of protein  Magic cup TID with meals, each supplement provides 290 kcal and 9 grams of protein  MVI  Liberalize diet   NUTRITION DIAGNOSIS:   Malnutrition (severe) related to acute illness, other (see comment) (Shigella infection) as evidenced by 10 percent weight loss in 2 months, energy intake < or equal to 50% for > or equal to 5 days, severe depletion of muscle mass, severe depletion of body fat.  GOAL:   Patient will meet greater than or equal to 90% of their needs  MONITOR:   PO intake, Supplement acceptance, Labs, Weight trends  REASON FOR ASSESSMENT:   Other (Comment) (low BMI)    ASSESSMENT:   56 y/o male lives in a group home with h/o Crohns disease, Shigella infection, schizophrenia, anemia, vitamin D deficiency admitted with diarrhea and weakness for 1 week   Met with pt and care giver in room today. Pt and care giver report that pt has not been eating much over the past 5 days. Caregiver reports that she caught pt throwing his food away. Pt does drink Ensure but in order to get Ensure at the group home, pt must have prescription. RD will request prescription from MD. Pt reports severe diarrhea for the past 1 1/2 weeks. Per chart, pt has lost 14lbs(10%) in two months; this is severe. Pt with h/o Crohn's disease but reports that he has not had a flare in many years. Pt was recently diagnosed with Shigella infection. Pt currently eating <50% of meals. RD will liberalize diet and add Ensure. Pt with hypokalemia; monitor and supplement as needed per MD discretion.    Medications reviewed and include: lovenox   Labs reviewed: Na 131(L), K 3.1(L), BUN 22(H), Mg 2.1 wnl Hgb 8.5(L), Hct 25.6(L)  Nutrition-Focused physical exam completed. Findings are severe fat and muscle  depletions over entire body, and no edema.   Diet Order:  Diet regular Room service appropriate? Yes; Fluid consistency: Thin  Skin:  Reviewed, no issues  Last BM:  10/22- type 7  Height:   Ht Readings from Last 1 Encounters:  01/06/17 5' 11" (1.803 m)    Weight:   Wt Readings from Last 1 Encounters:  01/06/17 126 lb 9.6 oz (57.4 kg)    Ideal Body Weight:  78.2 kg  BMI:  Body mass index is 17.66 kg/m.  Estimated Nutritional Needs:   Kcal:  1700-2000kcal/day   Protein:  86-97g/day   Fluid:  >1.7L/day   EDUCATION NEEDS:   Education needs addressed  Koleen Distance MS, RD, LDN Pager #[phone] After Hours Pager: [phone]

## 2017-01-06 NOTE — Plan of Care (Signed)
Problem: Activity: Goal: Risk for activity intolerance will decrease Outcome: Progressing Pt gets up to [facility]

## 2017-01-07 DIAGNOSIS — E43 Unspecified severe protein-calorie malnutrition: Secondary | ICD-10-CM | POA: Insufficient documentation

## 2017-01-07 LAB — URINALYSIS, ROUTINE W REFLEX MICROSCOPIC
BILIRUBIN URINE: NEGATIVE
Glucose, UA: NEGATIVE mg/dL
KETONES UR: NEGATIVE mg/dL
Nitrite: NEGATIVE
PROTEIN: NEGATIVE mg/dL
Specific Gravity, Urine: 1.017 (ref 1.005–1.030)
pH: 6 (ref 5.0–8.0)

## 2017-01-07 LAB — OCCULT BLOOD X 1 CARD TO LAB, STOOL: Fecal Occult Bld: NEGATIVE

## 2017-01-07 MED ORDER — ENSURE ENLIVE PO LIQD: 237.0000 mL | Freq: Two times a day (BID) | ORAL | 12 refills | Status: AC

## 2017-01-07 MED ORDER — ADULT MULTIVITAMIN W/MINERALS CH: 1.0000 | ORAL_TABLET | Freq: Every day | ORAL | 0 refills | Status: AC

## 2017-01-07 NOTE — NC FL2 (Signed)
[place] MEDICAID FL2 LEVEL OF CARE SCREENING TOOL     IDENTIFICATION  Patient Name: Jeff Long Birthdate: [birth year] Sex: male Admission Date (Current Location): 01/05/2017  [place] and [place] Number:  Chiropodist and Address:  [facility], [address], [place], [place] [address]      Provider Number: [id number]  Attending Physician Name and Address:  Enedina Finner, MD  Relative Name and Phone Number:       Current Level of Care: Hospital Recommended Level of Care: [facility] Prior Approval Number:    Date Approved/Denied:   PASRR Number:    Discharge Plan:  (family care/group home)    Current Diagnoses: Patient Active Problem List   Diagnosis Date Noted  . Protein-calorie malnutrition, severe 01/07/2017  . Diarrhea 01/05/2017  . Dehydration 01/05/2017  . AKI (acute kidney injury) (HCC) 01/05/2017  . Schizoaffective disorder, mixed type (HCC) 10/15/2014  . Hyponatremia 08/18/2014  . Anemia 08/18/2014  . Vitamin D deficiency 08/18/2014  . Tobacco abuse 08/18/2014    Orientation RESPIRATION BLADDER Height & Weight        Normal Continent Weight: 126 lb 9.6 oz (57.4 kg) Height:  5\' 11"  (180.3 cm)  BEHAVIORAL SYMPTOMS/MOOD NEUROLOGICAL BOWEL NUTRITION STATUS   (none)  (none) Incontinent (incontinent at times) Diet (regular)  AMBULATORY STATUS COMMUNICATION OF NEEDS Skin   Supervision Verbally Normal                       Personal Care Assistance Level of Assistance  Bathing, Dressing Bathing Assistance: Limited assistance   Dressing Assistance: Limited assistance     Functional Limitations Info             SPECIAL CARE FACTORS FREQUENCY                       Contractures Contractures Info: Not present    Additional Factors Info  Code Status Code Status Info: full             DISCHARGE MEDICATIONS:      Current Discharge Medication List       START taking these  medications   Details  feeding supplement, ENSURE ENLIVE, (ENSURE ENLIVE) LIQD Take 237 mLs by mouth 2 (two) times daily between meals. Qty: 237 mL, Refills: 12    Multiple Vitamin (MULTIVITAMIN WITH MINERALS) TABS tablet Take 1 tablet by mouth daily. Qty: 30 tablet, Refills: 0         CONTINUE these medications which have NOT CHANGED   Details  benztropine (COGENTIN) 0.5 MG tablet Take 1 tablet (0.5 mg total) by mouth 2 (two) times daily. Qty: 60 tablet, Refills: 0    cholecalciferol (VITAMIN D) 1000 UNITS tablet Take 1 tablet (1,000 Units total) by mouth daily.    divalproex (DEPAKOTE ER) 500 MG 24 hr tablet Take 500-1,000 mg by mouth 2 (two) times daily. 500 mg in the morning and 1000 mg at bedtime    ferrous sulfate 325 (65 FE) MG tablet Take 1 tablet (325 mg total) by mouth daily.    FLUoxetine (PROZAC) 20 MG tablet Take 20 mg by mouth every morning.    !! haloperidol (HALDOL) 5 MG tablet Take 1 tablet (5 mg total) by mouth every 8 (eight) hours as needed for agitation. Qty: 30 tablet, Refills: 0    !! haloperidol (HALDOL) 5 MG tablet Take 5 mg by mouth 2 (two) times daily.  loratadine (CLARITIN) 10 MG tablet Take 10 mg by mouth daily.    OLANZapine (ZYPREXA) 20 MG tablet Take 1.5 tablets (30 mg total) by mouth at bedtime.    paliperidone (INVEGA SUSTENNA) 156 MG/ML SUSP injection Inject 156 mg into the muscle every 30 (thirty) days.    traZODone (DESYREL) 50 MG tablet Take 50 mg by mouth at bedtime.    Relevant Imaging Results:  Relevant Lab Results:   Additional Information    York SpanielMonica Kellyann Ordway, LCSW

## 2017-01-07 NOTE — Progress Notes (Signed)
Pt discharged per MD order. Pt taken to car in wheelchair. Pt to return to his group home. Discharge paperwork reviewed with group home leader. IV removed by pt. [facility]

## 2017-01-07 NOTE — Discharge Summary (Signed)
[facility] - [place] at [facility]   PATIENT NAME: Jeff Long    MR#:  [id number]  DATE OF BIRTH:  1960-06-28  DATE OF ADMISSION:  01/05/2017 ADMITTING PHYSICIAN: Oralia Manisavid Willis, MD  DATE OF DISCHARGE: 01/07/17  PRIMARY CARE PHYSICIAN: Pa, [facility]    ADMISSION DIAGNOSIS:  Diarrhea of presumed infectious origin [R19.7] Hypomagnesemia [E83.42] Prolonged Q-T interval on ECG [R94.31] Acute dehydration [E86.0] Hypotension due to hypovolemia [I95.89, E86.1]  DISCHARGE DIAGNOSIS:  Acute Diarreha--improved Acute renal failure due to dehydration   SECONDARY DIAGNOSIS:   Past Medical History:  Diagnosis Date  . Asthma   . Crohn's disease (HCC)   . Hyperlipidemia   . Schizophrenia [facility](HCC)     HOSPITAL COURSE:   JerryVinsonis a 56 y.o.malewho presents with abdominal days of diarrhea. Patient states that he's been having some abdominal pain and frequent episodes of diarrhea, up to 7 or 8 times a day.  * AKI (acute kidney injury) (HCC) - likely prerenal insult due to dehydration from his diarrhea.  -cont to  hydrate him with IV fluids tonight, avoid nephrotoxins, monitor for expected improvement -creat 1.33--0.96 -no diarrhea today -ate BF  *Diarrhea - GI panel and C. difficile studies negative  *Dehydration - IV fluids as above  *Schizoaffective disorder, mixed type (HCC) - continue home meds  *protein calorie malnutiriton--severe Follow dietitian recs  Overall stable for d/c to group home  CONSULTS OBTAINED:    DRUG ALLERGIES:   Allergies  Allergen Reactions  . Lithium Other (See Comments)    Reaction:  Agitation    . Thorazine [Chlorpromazine] Other (See Comments)    Reaction:  Agitation     DISCHARGE MEDICATIONS:   Current Discharge Medication List    START taking these medications   Details  feeding supplement, ENSURE ENLIVE, (ENSURE ENLIVE) LIQD Take 237 mLs by mouth 2 (two) times daily between  meals. Qty: 237 mL, Refills: 12    Multiple Vitamin (MULTIVITAMIN WITH MINERALS) TABS tablet Take 1 tablet by mouth daily. Qty: 30 tablet, Refills: 0      CONTINUE these medications which have NOT CHANGED   Details  benztropine (COGENTIN) 0.5 MG tablet Take 1 tablet (0.5 mg total) by mouth 2 (two) times daily. Qty: 60 tablet, Refills: 0    cholecalciferol (VITAMIN D) 1000 UNITS tablet Take 1 tablet (1,000 Units total) by mouth daily.    divalproex (DEPAKOTE ER) 500 MG 24 hr tablet Take 500-1,000 mg by mouth 2 (two) times daily. 500 mg in the morning and 1000 mg at bedtime    ferrous sulfate 325 (65 FE) MG tablet Take 1 tablet (325 mg total) by mouth daily.    FLUoxetine (PROZAC) 20 MG tablet Take 20 mg by mouth every morning.    !! haloperidol (HALDOL) 5 MG tablet Take 1 tablet (5 mg total) by mouth every 8 (eight) hours as needed for agitation. Qty: 30 tablet, Refills: 0    !! haloperidol (HALDOL) 5 MG tablet Take 5 mg by mouth 2 (two) times daily.    loratadine (CLARITIN) 10 MG tablet Take 10 mg by mouth daily.    OLANZapine (ZYPREXA) 20 MG tablet Take 1.5 tablets (30 mg total) by mouth at bedtime.    paliperidone (INVEGA SUSTENNA) 156 MG/ML SUSP injection Inject 156 mg into the muscle every 30 (thirty) days.    traZODone (DESYREL) 50 MG tablet Take 50 mg by mouth at bedtime.     !! - Potential duplicate medications found. Please discuss with  provider.      If you experience worsening of your admission symptoms, develop shortness of breath, life threatening emergency, suicidal or homicidal thoughts you must seek medical attention immediately by calling 911 or calling your MD immediately  if symptoms less severe.  You Must read complete instructions/literature along with all the possible adverse reactions/side effects for all the Medicines you take and that have been prescribed to you. Take any new Medicines after you have completely understood and accept all the possible  adverse reactions/side effects.   Please note  You were cared for by a hospitalist during your hospital stay. If you have any questions about your discharge medications or the care you received while you were in the hospital after you are discharged, you can call the unit and asked to speak with the hospitalist on call if the hospitalist that took care of you is not available. Once you are discharged, your primary care physician will handle any further medical issues. Please note that NO REFILLS for any discharge medications will be authorized once you are discharged, as it is imperative that you return to your primary care physician (or establish a relationship with a primary care physician if you do not have one) for your aftercare needs so that they can reassess your need for medications and monitor your lab values. Today   SUBJECTIVE   No new complaints  VITAL SIGNS:  Blood pressure 96/63, pulse 77, temperature 98 F (36.7 C), temperature source Oral, resp. rate 18, height 5\' 11"  (1.803 m), weight 57.4 kg (126 lb 9.6 oz), SpO2 100 %.  I/O:   Intake/Output Summary (Last 24 hours) at 01/07/17 1057 Last data filed at 01/07/17 0114  Gross per 24 hour  Intake                0 ml  Output              600 ml  Net             -600 ml    PHYSICAL EXAMINATION:  GENERAL:  56 y.o.-year-old patient lying in the bed with no acute distress. Thin cachectic EYES: Pupils equal, round, reactive to light and accommodation. No scleral icterus. Extraocular muscles intact.  HEENT: Head atraumatic, normocephalic. Oropharynx and nasopharynx clear.  NECK:  Supple, no jugular venous distention. No thyroid enlargement, no tenderness.  LUNGS: Normal breath sounds bilaterally, no wheezing, rales,rhonchi or crepitation. No use of accessory muscles of respiration.  CARDIOVASCULAR: S1, S2 normal. No murmurs, rubs, or gallops.  ABDOMEN: Soft, non-tender, non-distended. Bowel sounds present. No organomegaly or  mass.  EXTREMITIES: No pedal edema, cyanosis, or clubbing.  NEUROLOGIC: Cranial nerves II through XII are intact. Muscle strength 5/5 in all extremities. Sensation intact. Gait not checked.  PSYCHIATRIC: The patient is alert and oriented x 3.  SKIN: No obvious rash, lesion, or ulcer.   DATA REVIEW:   CBC   Recent Labs Lab 01/06/17 0336  WBC 5.7  HGB 8.5*  HCT 25.6*  PLT 225    Chemistries   Recent Labs Lab 01/05/17 1955 01/06/17 0336  NA 128* 131*  K 3.5 3.1*  CL 95* 103  CO2 23 21*  GLUCOSE 108* 95  BUN 29* 22*  CREATININE 1.33* 0.96  CALCIUM 11.8* 10.1  MG 1.4* 2.1  AST 12*  --   ALT 6*  --   ALKPHOS 63  --   BILITOT 0.6  --     Microbiology Results   Recent  Results (from the past 240 hour(s))  Blood Culture (routine x 2)     Status: None (Preliminary result)   Collection Time: 01/05/17  7:56 PM  Result Value Ref Range Status   Specimen Description BLOOD LEFT ARM  Final   Special Requests   Final    BOTTLES DRAWN AEROBIC AND ANAEROBIC Blood Culture adequate volume   Culture NO GROWTH 2 DAYS  Final   Report Status PENDING  Incomplete  Blood Culture (routine x 2)     Status: None (Preliminary result)   Collection Time: 01/05/17  7:56 PM  Result Value Ref Range Status   Specimen Description BLOOD RIGHT ANTECUBITAL  Final   Special Requests   Final    BOTTLES DRAWN AEROBIC AND ANAEROBIC Blood Culture adequate volume   Culture NO GROWTH 2 DAYS  Final   Report Status PENDING  Incomplete  Gastrointestinal Panel by PCR , Stool     Status: None   Collection Time: 01/06/17 12:32 AM  Result Value Ref Range Status   Campylobacter species NOT DETECTED NOT DETECTED Final   Plesimonas shigelloides NOT DETECTED NOT DETECTED Final   Salmonella species NOT DETECTED NOT DETECTED Final   Yersinia enterocolitica NOT DETECTED NOT DETECTED Final   Vibrio species NOT DETECTED NOT DETECTED Final   Vibrio cholerae NOT DETECTED NOT DETECTED Final   Enteroaggregative E coli  (EAEC) NOT DETECTED NOT DETECTED Final   Enteropathogenic E coli (EPEC) NOT DETECTED NOT DETECTED Final   Enterotoxigenic E coli (ETEC) NOT DETECTED NOT DETECTED Final   Shiga like toxin producing E coli (STEC) NOT DETECTED NOT DETECTED Final   Shigella/Enteroinvasive E coli (EIEC) NOT DETECTED NOT DETECTED Final   Cryptosporidium NOT DETECTED NOT DETECTED Final   Cyclospora cayetanensis NOT DETECTED NOT DETECTED Final   Entamoeba histolytica NOT DETECTED NOT DETECTED Final   Giardia lamblia NOT DETECTED NOT DETECTED Final   Adenovirus F40/41 NOT DETECTED NOT DETECTED Final   Astrovirus NOT DETECTED NOT DETECTED Final   Norovirus GI/GII NOT DETECTED NOT DETECTED Final   Rotavirus A NOT DETECTED NOT DETECTED Final   Sapovirus (I, II, IV, and V) NOT DETECTED NOT DETECTED Final  C difficile quick scan w PCR reflex     Status: None   Collection Time: 01/06/17 12:32 AM  Result Value Ref Range Status   C Diff antigen NEGATIVE NEGATIVE Final   C Diff toxin NEGATIVE NEGATIVE Final   C Diff interpretation No C. difficile detected.  Final  MRSA PCR Screening     Status: None   Collection Time: 01/06/17 12:38 AM  Result Value Ref Range Status   MRSA by PCR NEGATIVE NEGATIVE Final    Comment:        The GeneXpert MRSA Assay (FDA approved for NASAL specimens only), is one component of a comprehensive MRSA colonization surveillance program. It is not intended to diagnose MRSA infection nor to guide or monitor treatment for MRSA infections.     RADIOLOGY:  No results found.   Management plans discussed with the patient, family and they are in agreement.  CODE STATUS:     Code Status Orders        Start     Ordered   01/06/17 0028  Full code  Continuous     01/06/17 0027    Code Status History    Date Active Date Inactive Code Status Order ID Comments User Context   08/11/2016  6:42 AM 08/12/2016 10:17 PM Full Code [id number]  Rhunette Croft,  Ankit, MD ED   10/14/2014  8:30 PM  10/16/2014  9:37 PM Full Code [id number]  Rolan Bucco, MD ED   08/19/2014 10:24 PM 08/23/2014  7:55 PM Full Code [id number]  Audery Amel, MD Inpatient   08/18/2014 10:10 PM 08/19/2014  5:48 PM Full Code [id number]  Katharina Caper, MD Inpatient   11/07/2011  8:34 PM 11/09/2011  7:05 PM Full Code [id number]  Cheri Guppy, MD ED      TOTAL TIME TAKING CARE OF THIS PATIENT: 40 minutes.    Tinsleigh Slovacek M.D on 01/07/2017 at 10:57 AM  Between 7am to 6pm - Pager - [phone] After 6pm go to [email or web address] - Social research officer, government  [facility]  Office  [phone]  CC: Primary care physician; Pa, [facility]

## 2017-01-07 NOTE — Clinical Social Work Note (Signed)
CSW informed MD is ready to discharge patient back to his group home. CSW contacted Marlette at the group home and she stated to give her a call when he is ready for pick up. CSW inquired as to who was patient's guardian and she stated that his mother is his guardian. CSW contacted patient's mother and notified her of discharge back to the group home. Patient's mother stated she had not even been notified by the group home that her son was admitted to the hospital. CSW advised that she speak with the [facility] about this. Patient's mother was very grateful for CSW phone call. York Spaniel MSW,LcSW [phone]

## 2017-01-10 LAB — CULTURE, BLOOD (ROUTINE X 2)
CULTURE: NO GROWTH
Culture: NO GROWTH
SPECIAL REQUESTS: ADEQUATE
Special Requests: ADEQUATE

## 2017-07-08 ENCOUNTER — Encounter: Payer: Self-pay | Admitting: Emergency Medicine

## 2017-07-08 ENCOUNTER — Emergency Department
Admission: EM | Admit: 2017-07-08 | Discharge: 2017-07-09 | Disposition: A | Discharge status: Home / Self Care | Payer: Medicaid Other | Attending: Emergency Medicine | Admitting: Emergency Medicine

## 2017-07-08 ENCOUNTER — Other Ambulatory Visit: Payer: Self-pay

## 2017-07-08 DIAGNOSIS — F250 Schizoaffective disorder, bipolar type: Secondary | ICD-10-CM | POA: Diagnosis present

## 2017-07-08 DIAGNOSIS — R440 Auditory hallucinations: Secondary | ICD-10-CM

## 2017-07-08 DIAGNOSIS — F209 Schizophrenia, unspecified: Secondary | ICD-10-CM | POA: Diagnosis not present

## 2017-07-08 DIAGNOSIS — J45909 Unspecified asthma, uncomplicated: Secondary | ICD-10-CM | POA: Diagnosis not present

## 2017-07-08 DIAGNOSIS — Z79899 Other long term (current) drug therapy: Secondary | ICD-10-CM | POA: Insufficient documentation

## 2017-07-08 DIAGNOSIS — E871 Hypo-osmolality and hyponatremia: Secondary | ICD-10-CM | POA: Diagnosis not present

## 2017-07-08 DIAGNOSIS — F17200 Nicotine dependence, unspecified, uncomplicated: Secondary | ICD-10-CM | POA: Insufficient documentation

## 2017-07-08 LAB — COMPREHENSIVE METABOLIC PANEL
ALK PHOS: 104 U/L (ref 38–126)
ALT: 10 U/L — ABNORMAL LOW (ref 17–63)
ANION GAP: 5 (ref 5–15)
AST: 14 U/L — ABNORMAL LOW (ref 15–41)
Albumin: 3.6 g/dL (ref 3.5–5.0)
BUN: 12 mg/dL (ref 6–20)
CALCIUM: 10.3 mg/dL (ref 8.9–10.3)
CO2: 28 mmol/L (ref 22–32)
Chloride: 99 mmol/L — ABNORMAL LOW (ref 101–111)
Creatinine, Ser: 1.03 mg/dL (ref 0.61–1.24)
GFR calc non Af Amer: >60 mL/min (ref >60)
Glucose, Bld: 90 mg/dL (ref 65–99)
POTASSIUM: 3.7 mmol/L (ref 3.5–5.1)
SODIUM: 132 mmol/L — AB (ref 135–145)
TOTAL PROTEIN: 7.5 g/dL (ref 6.5–8.1)
Total Bilirubin: 0.3 mg/dL (ref 0.3–1.2)

## 2017-07-08 LAB — CBC
HCT: 30.6 % — ABNORMAL LOW (ref 40.0–52.0)
Hemoglobin: 10.4 g/dL — ABNORMAL LOW (ref 13.0–18.0)
MCH: 33.1 pg (ref 26.0–34.0)
MCHC: 33.9 g/dL (ref 32.0–36.0)
MCV: 97.7 fL (ref 80.0–100.0)
Platelets: 351 10*3/uL (ref 150–440)
RBC: 3.13 MIL/uL — ABNORMAL LOW (ref 4.40–5.90)
RDW: 13.6 % (ref 11.5–14.5)
WBC: 4.5 10*3/uL (ref 3.8–10.6)

## 2017-07-08 LAB — URINE DRUG SCREEN, QUALITATIVE (ARMC ONLY)
Amphetamines, Ur Screen: NOT DETECTED
BARBITURATES, UR SCREEN: NOT DETECTED
BENZODIAZEPINE, UR SCRN: NOT DETECTED
Cannabinoid 50 Ng, Ur ~~LOC~~: NOT DETECTED
Cocaine Metabolite,Ur ~~LOC~~: NOT DETECTED
MDMA (Ecstasy)Ur Screen: NOT DETECTED
METHADONE SCREEN, URINE: NOT DETECTED
Opiate, Ur Screen: NOT DETECTED
Phencyclidine (PCP) Ur S: NOT DETECTED
TRICYCLIC, UR SCREEN: NOT DETECTED

## 2017-07-08 LAB — ETHANOL: Alcohol, Ethyl (B): <10 mg/dL (ref <10)

## 2017-07-08 LAB — SALICYLATE LEVEL: Salicylate Lvl: <7 mg/dL (ref 2.8–30.0)

## 2017-07-08 LAB — ACETAMINOPHEN LEVEL

## 2017-07-08 MED ORDER — LORATADINE 10 MG PO TABS
10.0000 mg | ORAL_TABLET | Freq: Every day | ORAL | Status: DC
  Administered 2017-07-09: 10 mg via ORAL
  Filled 2017-07-08: qty 1

## 2017-07-08 MED ORDER — DIVALPROEX SODIUM ER 250 MG PO TB24: 500.0000 mg | ORAL_TABLET | Freq: Two times a day (BID) | ORAL | Status: DC

## 2017-07-08 MED ORDER — ADULT MULTIVITAMIN W/MINERALS CH
1.0000 | ORAL_TABLET | Freq: Every day | ORAL | Status: DC
  Administered 2017-07-09: 1 via ORAL
  Filled 2017-07-08: qty 1

## 2017-07-08 MED ORDER — TRAZODONE HCL 50 MG PO TABS
50.0000 mg | ORAL_TABLET | Freq: Every day | ORAL | Status: DC
  Administered 2017-07-08: 50 mg via ORAL
  Filled 2017-07-08: qty 1

## 2017-07-08 MED ORDER — FLUOXETINE HCL 20 MG PO CAPS
20.0000 mg | ORAL_CAPSULE | ORAL | Status: DC
  Administered 2017-07-09: 20 mg via ORAL
  Filled 2017-07-08 (×2): qty 1

## 2017-07-08 MED ORDER — OLANZAPINE 10 MG PO TABS
20.0000 mg | ORAL_TABLET | Freq: Every day | ORAL | Status: DC
Start: 2017-07-08 — End: 2017-07-09
  Administered 2017-07-08: 20 mg via ORAL
  Filled 2017-07-08: qty 2

## 2017-07-08 MED ORDER — VITAMIN D 1000 UNITS PO TABS
1000.0000 [IU] | ORAL_TABLET | Freq: Every day | ORAL | Status: DC
  Administered 2017-07-09: 1000 [IU] via ORAL
  Filled 2017-07-08: qty 1

## 2017-07-08 MED ORDER — DIVALPROEX SODIUM ER 250 MG PO TB24
500.0000 mg | ORAL_TABLET | Freq: Every morning | ORAL | Status: DC
  Administered 2017-07-09: 500 mg via ORAL
  Filled 2017-07-08: qty 2

## 2017-07-08 MED ORDER — ENSURE ENLIVE PO LIQD
237.0000 mL | Freq: Two times a day (BID) | ORAL | Status: DC
  Administered 2017-07-09: 237 mL via ORAL

## 2017-07-08 MED ORDER — HALOPERIDOL 5 MG PO TABS: 5.0000 mg | ORAL_TABLET | Freq: Three times a day (TID) | ORAL | Status: DC | PRN

## 2017-07-08 MED ORDER — FERROUS SULFATE 325 (65 FE) MG PO TABS
325.0000 mg | ORAL_TABLET | Freq: Every day | ORAL | Status: DC
  Administered 2017-07-09: 325 mg via ORAL
  Filled 2017-07-08: qty 1

## 2017-07-08 MED ORDER — HALOPERIDOL 5 MG PO TABS
5.0000 mg | ORAL_TABLET | Freq: Two times a day (BID) | ORAL | Status: DC
  Administered 2017-07-08 – 2017-07-09 (×2): 5 mg via ORAL
  Filled 2017-07-08 (×2): qty 1

## 2017-07-08 MED ORDER — BENZTROPINE MESYLATE 1 MG PO TABS
0.5000 mg | ORAL_TABLET | Freq: Two times a day (BID) | ORAL | Status: DC
  Administered 2017-07-08 – 2017-07-09 (×2): 0.5 mg via ORAL
  Filled 2017-07-08 (×2): qty 1

## 2017-07-08 MED ORDER — DIVALPROEX SODIUM ER 250 MG PO TB24: 1000.0000 mg | ORAL_TABLET | Freq: Every evening | ORAL | Status: DC

## 2017-07-08 NOTE — ED Notes (Signed)
Pt's legal guardian Jeff Long [phone]) contacted and informed pt here for evaluation; sister is aware and st pt is resident at group home in [place], ?[facility] [phone])

## 2017-07-08 NOTE — ED Triage Notes (Signed)
Patient ambulatory to triage with steady gait, without difficulty or distress noted, in custody of [place] [employer] for IVC; papers indicate pt with schizophrenia and threatening other; pt denies SI or HI

## 2017-07-08 NOTE — ED Provider Notes (Signed)
[facility] Emergency Department Provider Note  ____________________________________________  Time seen: Approximately 10:25 PM  I have reviewed the triage vital signs and the nursing notes.   HISTORY  Chief Complaint Mental Health Problem    HPI Jeff Long is a 57 y.o. male with a history of schizophrenia, hyperlipidemia, asthma, Crohn's disease, brought from [facility] for threatening to kill people.  The patient states that earlier today he did tell someone at [facility] that he was having auditory hallucinations but states that the voices were telling him not to hurt anybody.  He states he does not want to hurt himself, or anyone else.  He denies SI, HI, or significant change in his hallucinations.  He has no medical complaints at this time  Past Medical History:  Diagnosis Date  . Asthma   . Crohn's disease (HCC)   . Hyperlipidemia   . Schizophrenia [facility])     Patient Active Problem List   Diagnosis Date Noted  . Protein-calorie malnutrition, severe 01/07/2017  . Diarrhea 01/05/2017  . Dehydration 01/05/2017  . AKI (acute kidney injury) (HCC) 01/05/2017  . Schizoaffective disorder, mixed type (HCC) 10/15/2014  . Hyponatremia 08/18/2014  . Anemia 08/18/2014  . Vitamin D deficiency 08/18/2014  . Tobacco abuse 08/18/2014    Past Surgical History:  Procedure Laterality Date  . NO PAST SURGERIES      Current Outpatient Rx  . Order #: [id number] Class: No Print  . Order #: [id number] Class: No Print  . Order #: [id number] Class: Historical Med  . Order #: [id number] Class: Normal  . Order #: [id number] Class: No Print  . Order #: [id number] Class: Historical Med  . Order #: [id number] Class: Print  . Order #: [id number] Class: Historical Med  . Order #: [id number] Class: Historical Med  . Order #: [id number] Class: Normal  . Order #: [id number] Class: No Print  . Order #: [id number] Class: Historical Med  . Order #: [id number] Class: Historical Med    Allergies Lithium  and Thorazine [chlorpromazine]  Family History  Family history unknown: Yes    Social History Social History   Tobacco Use  . Smoking status: Current Every Day Smoker    Packs/day: 0.50  . Smokeless tobacco: Never Used  Substance Use Topics  . Alcohol use: No  . Drug use: No    Review of Systems Constitutional: No fever/chills.  Or syncope. Eyes: No visual changes. ENT: No sore throat. No congestion or rhinorrhea. Cardiovascular: Denies chest pain. Denies palpitations. Respiratory: Denies shortness of breath.  No cough. Gastrointestinal: No abdominal pain.  No nausea, no vomiting.  No diarrhea.  No constipation. Genitourinary: Negative for dysuria. Musculoskeletal: Negative for back pain. Skin: Negative for rash. Neurological: Negative for headaches. No focal numbness, tingling or weakness.  Psychiatric:Positive report of a child of the patient denies this on my examination.  No SI or changes in his chronic auditory hallucinations.   ____________________________________________   PHYSICAL EXAM:  VITAL SIGNS: ED Triage Vitals  Enc Vitals Group     BP 07/08/17 2019 106/67     Pulse Rate 07/08/17 2019 99     Resp 07/08/17 2019 20     Temp 07/08/17 2019 98 F (36.7 C)     Temp Source 07/08/17 2019 Oral     SpO2 07/08/17 2019 99 %     Weight 07/08/17 2017 140 lb (63.5 kg)     Height 07/08/17 2017 6\' 1"  (1.854 m)     Head Circumference --      Peak Flow --  Pain Score 07/08/17 2017 0     Pain Loc --      Pain Edu? --      Excl. in GC? --     Constitutional: Alert and oriented.  Chronically ill appearing and in no acute distress. Answers questions appropriately. Eyes: Conjunctivae are normal.  EOMI. No scleral icterus. Head: Atraumatic. Nose: No congestion/rhinnorhea. Mouth/Throat: Mucous membranes are moist.  Neck: No stridor.  Supple.  No meningismus. Cardiovascular: Normal rate, regular rhythm. No murmurs, rubs or gallops.  Respiratory: Normal  respiratory effort.  No accessory muscle use or retractions. Lungs CTAB.  No wheezes, rales or ronchi. Musculoskeletal: No LE edema. Neurologic:  A&Ox3.  Speech is clear.  Face and smile are symmetric.  EOMI.  Moves all extremities well. Skin:  Skin is warm, dry and intact. No rash noted. Psychiatric: Mood and affect are normal.  The patient is cooperative, with normal nonpressured speech.  He states he is not currently having auditory hallucinations but did have some earlier today.  He states that he has no HI, SI.  ____________________________________________   LABS (all labs ordered are listed, but only abnormal results are displayed)  Labs Reviewed  COMPREHENSIVE METABOLIC PANEL - Abnormal; Notable for the following components:      Result Value   Sodium 132 (*)    Chloride 99 (*)    AST 14 (*)    ALT 10 (*)    All other components within normal limits  ACETAMINOPHEN LEVEL - Abnormal; Notable for the following components:   Acetaminophen (Tylenol), Serum <10 (*)    All other components within normal limits  CBC - Abnormal; Notable for the following components:   RBC 3.13 (*)    Hemoglobin 10.4 (*)    HCT 30.6 (*)    All other components within normal limits  ETHANOL  SALICYLATE LEVEL  URINE DRUG SCREEN, QUALITATIVE ([facility] ONLY)   ____________________________________________  EKG  Not indicated ____________________________________________  RADIOLOGY  No results found.  ____________________________________________   PROCEDURES  Procedure(s) performed: None  Procedures  Critical Care performed: No ____________________________________________   INITIAL IMPRESSION / ASSESSMENT AND PLAN / ED COURSE  Pertinent labs & imaging results that were available during my care of the patient were reviewed by me and considered in my medical decision making (see chart for details).  57 y.o. male with a history of schizophrenia brought from [facility] for threatening to kill  someone, auditory hallucinations.  Overall, the patient is hemodynamically stable.  Today, he denies any change in his auditory hallucination and denies any HI, SI.  He has no medical complaints.  The patient is under IVC and I will continue this until further mastication can be made into his statements from earlier today with full psychiatric evaluation.  Clinically, the patient does have chronic hyponatremia which is grossly unchanged or better than baseline.  I have encouraged oral intake of water.  He is medically cleared for psychiatric disposition.  ____________________________________________  FINAL CLINICAL IMPRESSION(S) / ED DIAGNOSES  Final diagnoses:  Chronic hyponatremia  Auditory hallucinations         NEW MEDICATIONS STARTED DURING THIS VISIT:  New Prescriptions   No medications on file      Rockne Menghini, MD 07/08/17 2231

## 2017-07-08 NOTE — ED Notes (Signed)
Pt. Alert and oriented, warm and dry, in no distress. Pt. Denies SI, HI, and VH. Pt states hearing voices to not hurt people. Soda and sandwich tray given for snack. Pt. Encouraged to let nursing staff know of any concerns or needs.

## 2017-07-08 NOTE — ED Notes (Signed)

## 2017-07-09 DIAGNOSIS — F250 Schizoaffective disorder, bipolar type: Secondary | ICD-10-CM

## 2017-07-09 NOTE — ED Notes (Signed)
BEHAVIORAL HEALTH ROUNDING Patient sleeping: Yes.   Patient alert and oriented: eyes closed  Appears to be asleep Behavior appropriate: Yes.  ; If no, describe:  Nutrition and fluids offered: Yes  Toileting and hygiene offered: sleeping Sitter present: q 15 minute observations and security monitoring Law enforcement present: yes   

## 2017-07-09 NOTE — ED Notes (Signed)
ED Is the patient under IVC or is there intent for IVC: Yes.   Is the patient medically cleared: Yes.   Is there vacancy in the ED BHU: Yes.   Is the population mix appropriate for patient: Yes.   Is the patient awaiting placement in inpatient or outpatient setting:  Has the patient had a psychiatric consult: Yes.   Survey of unit performed for contraband, proper placement and condition of furniture, tampering with fixtures in bathroom, shower, and each patient room: Yes.  ; Findings:  APPEARANCE/BEHAVIOR Calm and cooperative NEURO ASSESSMENT Orientation: oriented x3  Denies pain Hallucinations: No.None noted (Hallucinations)  Denies  Speech: Normal Gait: normal RESPIRATORY ASSESSMENT Even  Unlabored respirations  CARDIOVASCULAR ASSESSMENT Pulses equal   regular rate  Skin warm and dry   GASTROINTESTINAL ASSESSMENT no GI complaint EXTREMITIES Full ROM  PLAN OF CARE Provide calm/safe environment. Vital signs assessed twice daily. ED BHU Assessment once each 12-hour shift. Collaborate with TTS daily or as condition indicates. Assure the ED provider has rounded once each shift. Provide and encourage hygiene. Provide redirection as needed. Assess for escalating behavior; address immediately and inform ED provider.  Assess family dynamic and appropriateness for visitation as needed: Yes.  ; If necessary, describe findings:  Educate the patient/family about BHU procedures/visitation: Yes.  ; If necessary, describe findings:

## 2017-07-09 NOTE — BH Assessment (Signed)
Per Dr. Clapac's patient doesn't meet criteria for inpatient psychiatric treatment and can be discharged back to group home.  

## 2017-07-09 NOTE — ED Notes (Signed)
Pt ambulated to bathroom unassisted. Pt is eating breakfast at this time.
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
## 2017-07-09 NOTE — ED Notes (Signed)
Breakfast tray placed in pt room. Pt sleeping 

## 2017-07-09 NOTE — ED Notes (Addendum)
Patient observed lying in bed with eyes closed  Even, unlabored respirations observed   NAD pt appears to be sleeping  I will continue to monitor along with every 15 minute visual observations and ongoing security monitoring    

## 2017-07-09 NOTE — ED Notes (Signed)
IVC/ Consult pending 

## 2017-07-09 NOTE — ED Notes (Signed)
Patient changed into clean purple scrubs. Patient's linen also changed.

## 2017-07-09 NOTE — ED Notes (Signed)
DR CLAPACS RESCINDED IVC PAPERS/MD GOODMAN, RN AMY T. AND ODS SECURITY MADE AWARE.

## 2017-07-09 NOTE — ED Notes (Signed)
BEHAVIORAL HEALTH ROUNDING Patient sleeping: No. Patient alert and oriented: yes Behavior appropriate: Yes.  ; If no, describe:  Nutrition and fluids offered: yes Toileting and hygiene offered: Yes  Sitter present: q15 minute observations and security monitoring Law enforcement present: Yes    

## 2017-07-09 NOTE — BH Assessment (Signed)
Writer attempted to call group home at [phone] and left HIPPA compliant message for return call.

## 2017-07-09 NOTE — BH Assessment (Signed)
This Clinical research associate spoke with Marlette at [facility] and inform her patient is being discharged from ED and needs to be picked up. Marlette stated patient will be picked up at 3pm. Informed EDRN Amy.

## 2017-07-09 NOTE — ED Notes (Signed)

## 2017-07-09 NOTE — ED Notes (Signed)
Pt given lunch tray and cup of ice water

## 2017-07-09 NOTE — Consult Note (Signed)
[facility] Consult   Reason for Consult: Consult for 57 year old man with schizophrenia or schizoaffective disorder who came to the emergency room more or less at his own request. Referring Physician: Archie Balboa Patient Identification: Jeff Long MRN:  [id number] Principal Diagnosis: Schizoaffective disorder, mixed type [facility]) Diagnosis:   Patient Active Problem List   Diagnosis Date Noted  . Protein-calorie malnutrition, severe [E43] 01/07/2017  . Diarrhea [R19.7] 01/05/2017  . Dehydration [E86.0] 01/05/2017  . AKI (acute kidney injury) ([place]) [N17.9] 01/05/2017  . Schizoaffective disorder, mixed type ([place]) [F25.0] 10/15/2014  . Hyponatremia [E87.1] 08/18/2014  . Anemia [D64.9] 08/18/2014  . Vitamin D deficiency [E55.9] 08/18/2014  . Tobacco abuse [Z72.0] 08/18/2014    Total Time spent with patient: 1 hour  Subjective:   Jeff Long is a 57 y.o. male patient admitted with "I thought they put something in my food".  HPI: Patient interviewed chart reviewed.  Patient known from previous encounters.  57 year old man with schizophrenia.  He tells me that yesterday he thought that he smelled something funny in his food.  He developed the belief that someone had put fecal matter into his food.  I would note that this is actually a delusion he has had in the past.  He started making phone calls around about it and eventually requested to come to the hospital.  Patient denies any suicidal or homicidal ideation.  No report of any violent behavior.  He tells me that he called his mother and asked her if she would let him live with her and she told him that it was not possible because of her section 8 housing and how crowded it was already.  Patient is not reporting any acute hallucinations.  He says he has been compliant with his medicine denies any drug use.  Medical history: History of chronic hyponatremia.  Substance abuse history: Intermittent abuse of substances in the past  that has been less of an issue recently.  Social history: He does have a legal guardian.  Lives in a group home.  Maintains pretty close contact with his family of origin.  Past Psychiatric History: Patient has had multiple hospitalizations including lengthy ones in the past.  He has some history of aggression in the past when he is psychotic.  He has been on multiple antipsychotics.  Risk to Self: Suicidal Ideation: No Suicidal Intent: No-Not Currently/Within Last 6 Months Is patient at risk for suicide?: No, but patient needs Medical Clearance Suicidal Plan?: No Access to Means: No What has been your use of drugs/alcohol within the last 12 months?: Pt denies How many times?: 0 Other Self Harm Risks: n/a Triggers for Past Attempts: Unknown Intentional Self Injurious Behavior: None Risk to Others: Homicidal Ideation: No Thoughts of Harm to Others: No-Not Currently Present/Within Last 6 Months Current Homicidal Intent: No-Not Currently/Within Last 6 Months Current Homicidal Plan: No-Not Currently/Within Last 6 Months Access to Homicidal Means: No Identified Victim: n/a History of harm to others?: No Assessment of Violence: None Noted Violent Behavior Description: pt denies Does patient have access to weapons?: No Criminal Charges Pending?: No Does patient have a court date: No Prior Inpatient Therapy: Prior Inpatient Therapy: No Prior Outpatient Therapy: Prior Outpatient Therapy: No Does patient have an ACCT team?: No Does patient have Intensive In-House Services?  : No Does patient have [facility] services? : No Does patient have P4CC services?: No  Past Medical History:  Past Medical History:  Diagnosis Date  . Asthma   . Crohn's  disease ([place])   . Hyperlipidemia   . Schizophrenia [facility])     Past Surgical History:  Procedure Laterality Date  . NO PAST SURGERIES     Family History:  Family History  Family history unknown: Yes   Family Psychiatric  History: None Social  History:  Social History   Substance and Sexual Activity  Alcohol Use No     Social History   Substance and Sexual Activity  Drug Use No    Social History   Socioeconomic History  . Marital status: Single    Spouse name: Not on file  . Number of children: Not on file  . Years of education: Not on file  . Highest education level: Not on file  Occupational History  . Not on file  Social Needs  . Financial resource strain: Not on file  . Food insecurity:    Worry: Not on file    Inability: Not on file  . Transportation needs:    Medical: Not on file    Non-medical: Not on file  Tobacco Use  . Smoking status: Current Every Day Smoker    Packs/day: 0.50  . Smokeless tobacco: Never Used  Substance and Sexual Activity  . Alcohol use: No  . Drug use: No  . Sexual activity: Not on file  Lifestyle  . Physical activity:    Days per week: Not on file    Minutes per session: Not on file  . Stress: Not on file  Relationships  . Social connections:    Talks on phone: Not on file    Gets together: Not on file    Attends religious service: Not on file    Active member of club or organization: Not on file    Attends meetings of clubs or organizations: Not on file    Relationship status: Not on file  Other Topics Concern  . Not on file  Social History Narrative   The patient is currently a resident at a group home. He has never been married and has no children. His sister and mother live in [place]. He has a 10th grade education and is on Disability since early 74s.   Additional Social History:    Allergies:   Allergies  Allergen Reactions  . Lithium Other (See Comments)    Reaction:  Agitation    . Thorazine [Chlorpromazine] Other (See Comments)    Reaction:  Agitation     Labs:  Results for orders placed or performed during the hospital encounter of 07/08/17 (from the past 48 hour(s))  Comprehensive metabolic panel     Status: Abnormal   Collection Time:  07/08/17  8:22 PM  Result Value Ref Range   Sodium 132 (L) 135 - 145 mmol/L   Potassium 3.7 3.5 - 5.1 mmol/L   Chloride 99 (L) 101 - 111 mmol/L   CO2 28 22 - 32 mmol/L   Glucose, Bld 90 65 - 99 mg/dL   BUN 12 6 - 20 mg/dL   Creatinine, Ser 1.03 0.61 - 1.24 mg/dL   Calcium 10.3 8.9 - 10.3 mg/dL   Total Protein 7.5 6.5 - 8.1 g/dL   Albumin 3.6 3.5 - 5.0 g/dL   AST 14 (L) 15 - 41 U/L   ALT 10 (L) 17 - 63 U/L   Alkaline Phosphatase 104 38 - 126 U/L   Total Bilirubin 0.3 0.3 - 1.2 mg/dL   GFR calc non Af Amer >60 >60 mL/min   GFR calc Af Amer >  60 >60 mL/min    Comment: (NOTE) The eGFR has been calculated using the CKD EPI equation. This calculation has not been validated in all clinical situations. eGFR's persistently <60 mL/min signify possible Chronic Kidney Disease.    Anion gap 5 5 - 15    Comment: Performed at [facility], [address] [place], [place] [address]  Ethanol     Status: None   Collection Time: 07/08/17  8:22 PM  Result Value Ref Range   Alcohol, Ethyl (B) <10 <10 mg/dL    Comment:        LOWEST DETECTABLE LIMIT FOR SERUM ALCOHOL IS 10 mg/dL FOR MEDICAL PURPOSES ONLY Performed at [facility], [address] [place], [place] [address]   Salicylate level     Status: None   Collection Time: 07/08/17  8:22 PM  Result Value Ref Range   Salicylate Lvl <3.5 2.8 - 30.0 mg/dL    Comment: Performed at [facility], [address] [place], [place] [address]  Acetaminophen level     Status: Abnormal   Collection Time: 07/08/17  8:22 PM  Result Value Ref Range   Acetaminophen (Tylenol), Serum <10 (L) 10 - 30 ug/mL    Comment:        THERAPEUTIC CONCENTRATIONS VARY SIGNIFICANTLY. A RANGE OF 10-30 ug/mL MAY BE AN EFFECTIVE CONCENTRATION FOR MANY PATIENTS. HOWEVER, SOME ARE BEST TREATED AT CONCENTRATIONS OUTSIDE THIS RANGE. ACETAMINOPHEN CONCENTRATIONS >150 ug/mL AT 4 HOURS AFTER INGESTION AND >50 ug/mL AT 12 HOURS AFTER  INGESTION ARE OFTEN ASSOCIATED WITH TOXIC REACTIONS. Performed at [facility], [address] [place], [place] [address]   cbc     Status: Abnormal   Collection Time: 07/08/17  8:22 PM  Result Value Ref Range   WBC 4.5 3.8 - 10.6 K/uL   RBC 3.13 (L) 4.40 - 5.90 MIL/uL   Hemoglobin 10.4 (L) 13.0 - 18.0 g/dL   HCT 30.6 (L) 40.0 - 52.0 %   MCV 97.7 80.0 - 100.0 fL   MCH 33.1 26.0 - 34.0 pg   MCHC 33.9 32.0 - 36.0 g/dL   RDW 13.6 11.5 - 14.5 %   Platelets 351 150 - 440 K/uL    Comment: Performed at [facility], [address] [place], [place] [address]  Urine Drug Screen, Qualitative     Status: None   Collection Time: 07/08/17  8:22 PM  Result Value Ref Range   Tricyclic, Ur Screen NONE DETECTED NONE DETECTED   Amphetamines, Ur Screen NONE DETECTED NONE DETECTED   MDMA (Ecstasy)Ur Screen NONE DETECTED NONE DETECTED   Cocaine Metabolite,Ur [place] NONE DETECTED NONE DETECTED   Opiate, Ur Screen NONE DETECTED NONE DETECTED   Phencyclidine (PCP) Ur S NONE DETECTED NONE DETECTED   Cannabinoid 50 Ng, Ur [place] NONE DETECTED NONE DETECTED   Barbiturates, Ur Screen NONE DETECTED NONE DETECTED   Benzodiazepine, Ur Scrn NONE DETECTED NONE DETECTED   Methadone Scn, Ur NONE DETECTED NONE DETECTED    Comment: (NOTE) Tricyclics + metabolites, urine    Cutoff 1000 ng/mL Amphetamines + metabolites, urine  Cutoff 1000 ng/mL MDMA (Ecstasy), urine              Cutoff 500 ng/mL Cocaine Metabolite, urine          Cutoff 300 ng/mL Opiate + metabolites, urine        Cutoff 300 ng/mL Phencyclidine (PCP), urine         Cutoff 25 ng/mL Cannabinoid, urine  Cutoff 50 ng/mL Barbiturates + metabolites, urine  Cutoff 200 ng/mL Benzodiazepine, urine              Cutoff 200 ng/mL Methadone, urine                   Cutoff 300 ng/mL The urine drug screen provides only a preliminary, unconfirmed analytical test result and should not be used for non-medical purposes. Clinical  consideration and professional judgment should be applied to any positive drug screen result due to possible interfering substances. A more specific alternate chemical method must be used in order to obtain a confirmed analytical result. Gas chromatography / mass spectrometry (GC/MS) is the preferred confirmat ory method. Performed at [facility], [address], [place], [place] [address]     Current Facility-Administered Medications  Medication Dose Route Frequency Provider Last Rate Last Dose  . benztropine (COGENTIN) tablet 0.5 mg  0.5 mg Oral BID Eula Listen, MD   0.5 mg at 07/09/17 1258  . cholecalciferol (VITAMIN D) tablet 1,000 Units  1,000 Units Oral Daily Eula Listen, MD   1,000 Units at 07/09/17 1259  . divalproex (DEPAKOTE ER) 24 hr tablet 500 mg  500 mg Oral q morning - 10a Eula Listen, MD   500 mg at 07/09/17 1258   And  . divalproex (DEPAKOTE ER) 24 hr tablet 1,000 mg  1,000 mg Oral QPM Eula Listen, MD      . feeding supplement (ENSURE ENLIVE) (ENSURE ENLIVE) liquid 237 mL  237 mL Oral BID BM Eula Listen, MD   237 mL at 07/09/17 1304  . ferrous sulfate tablet 325 mg  325 mg Oral Daily Eula Listen, MD   325 mg at 07/09/17 1259  . FLUoxetine (PROZAC) capsule 20 mg  20 mg Oral [place] Levo, Anne-Caroline, MD   20 mg at 07/09/17 1258  . haloperidol (HALDOL) tablet 5 mg  5 mg Oral Q8H PRN Eula Listen, MD      . haloperidol (HALDOL) tablet 5 mg  5 mg Oral BID Eula Listen, MD   5 mg at 07/09/17 1257  . loratadine (CLARITIN) tablet 10 mg  10 mg Oral Daily Eula Listen, MD   10 mg at 07/09/17 1259  . multivitamin with minerals tablet 1 tablet  1 tablet Oral Daily Eula Listen, MD   1 tablet at 07/09/17 1258  . OLANZapine (ZYPREXA) tablet 20 mg  20 mg Oral QHS Eula Listen, MD   20 mg at 07/08/17 2314  . traZODone (DESYREL) tablet 50 mg  50 mg Oral QHS Eula Listen, MD   50 mg at 07/08/17 2314   Current Outpatient Medications  Medication Sig Dispense Refill  . benztropine (COGENTIN) 0.5 MG tablet Take 1 tablet (0.5 mg total) by mouth 2 (two) times daily. 60 tablet 0  . cholecalciferol (VITAMIN D) 1000 UNITS tablet Take 1 tablet (1,000 Units total) by mouth daily.    . divalproex (DEPAKOTE ER) 500 MG 24 hr tablet Take 500-1,000 mg by mouth 2 (two) times daily. 500 mg in the morning and 1000 mg at bedtime    . feeding supplement, ENSURE ENLIVE, (ENSURE ENLIVE) LIQD Take 237 mLs by mouth 2 (two) times daily between meals. 237 mL 12  . ferrous sulfate 325 (65 FE) MG tablet Take 1 tablet (325 mg total) by mouth daily.    Marland Kitchen FLUoxetine (PROZAC) 20 MG tablet Take 20 mg by mouth every morning.    . haloperidol (HALDOL) 5 MG tablet Take  1 tablet (5 mg total) by mouth every 8 (eight) hours as needed for agitation. 30 tablet 0  . haloperidol (HALDOL) 5 MG tablet Take 5 mg by mouth 2 (two) times daily.    Marland Kitchen loratadine (CLARITIN) 10 MG tablet Take 10 mg by mouth daily.    . Multiple Vitamin (MULTIVITAMIN WITH MINERALS) TABS tablet Take 1 tablet by mouth daily. 30 tablet 0  . OLANZapine (ZYPREXA) 20 MG tablet Take 1.5 tablets (30 mg total) by mouth at bedtime. (Patient taking differently: Take 20 mg by mouth at bedtime. )    . paliperidone (INVEGA SUSTENNA) 156 MG/ML SUSP injection Inject 156 mg into the muscle every 30 (thirty) days.    . traZODone (DESYREL) 50 MG tablet Take 50 mg by mouth at bedtime.      Musculoskeletal: Strength & Muscle Tone: within normal limits Gait & Station: normal Patient leans: N/A  Psychiatric Specialty Exam: Physical Exam  Nursing note and vitals reviewed. Constitutional: He appears well-developed and well-nourished.  HENT:  Head: Normocephalic and atraumatic.  Eyes: Pupils are equal, round, and reactive to light. Conjunctivae are normal.  Neck: Normal range of motion.  Cardiovascular: Regular rhythm and normal  heart sounds.  Respiratory: Effort normal. No respiratory distress.  GI: Soft.  Musculoskeletal: Normal range of motion.  Neurological: He is alert.  Skin: Skin is warm and dry.  Psychiatric: His affect is blunt. His speech is delayed. He is slowed. Thought content is delusional. Cognition and memory are impaired. He expresses impulsivity. He expresses no homicidal and no suicidal ideation.    Review of Systems  Constitutional: Negative.   HENT: Negative.   Eyes: Negative.   Respiratory: Negative.   Cardiovascular: Negative.   Gastrointestinal: Negative.   Musculoskeletal: Negative.   Skin: Negative.   Neurological: Negative.   Psychiatric/Behavioral: Negative.     Blood pressure 106/67, pulse 99, temperature 98 F (36.7 C), temperature source Oral, resp. rate 20, height '6\' 1"'$  (1.854 m), weight 63.5 kg (140 lb), SpO2 99 %.Body mass index is 18.47 kg/m.  General Appearance: Casual  Eye Contact:  Good  Speech:  Clear and Coherent  Volume:  Normal  Mood:  Euphoric  Affect:  Constricted  Thought Process:  Goal Directed  Orientation:  Full (Time, Place, and Person)  Thought Content:  Logical  Suicidal Thoughts:  No  Homicidal Thoughts:  No  Memory:  Immediate;   Fair Recent;   Fair Remote;   Fair  Judgement:  Impaired  Insight:  Shallow  Psychomotor Activity:  Decreased  Concentration:  Concentration: Fair  Recall:  [employer] of Knowledge:  Fair  Language:  Fair  Akathisia:  No  Handed:  Right  AIMS (if indicated):     Assets:  Desire for Improvement Housing Physical Health Social Support  ADL's:  Impaired  Cognition:  Impaired,  Mild  Sleep:        Treatment Plan Summary: Daily contact with patient to assess and evaluate symptoms and progress in treatment, Medication management and Plan Patient with schizophrenia who appears to my examination today to be at his baseline.  He is no longer complaining about there being feces in his food.  He has eaten his food  here at the hospital without any complaint.  Denies having any concerns or fear about going back to his group home.  Not endorsing any active suicidality and has a pleasant and cooperative affect.  Medically stable.  No need for further inpatient treatment.  Encourage patient  to continue with outpatient follow-up and medication.  Case reviewed with TTS and emergency room physician.  Disposition: Patient does not meet criteria for psychiatric inpatient admission. Supportive therapy provided about ongoing stressors.  Alethia Berthold, MD 07/09/2017 3:38 PM

## 2017-07-09 NOTE — Discharge Instructions (Addendum)
Please seek medical attention and help for any thoughts about wanting to harm yourself, harm others, any concerning change in behavior, severe depression, inappropriate drug use or any other new or concerning symptoms. ° °

## 2017-07-09 NOTE — BH Assessment (Signed)
This Clinical research associate contacted patient mother and legal guardian Jeff Long to inform of patient being discharged back to the group home. Mother stated group home's telephone number is [phone].   Writer attempted to call group home at [phone] and left HIPPA compliant message for return call.

## 2017-07-11 LAB — HIV ANTIBODY (ROUTINE TESTING W REFLEX): HIV SCREEN 4TH GENERATION: NONREACTIVE
